# Patient Record
Sex: Female | Born: 1960
Health system: Southern US, Community
[De-identification: ages and names within clinical notes are randomized; demographics above are authoritative.]

## PROBLEM LIST (undated history)

## (undated) DIAGNOSIS — Z8619 Personal history of other infectious and parasitic diseases: Secondary | ICD-10-CM

## (undated) DIAGNOSIS — K625 Hemorrhage of anus and rectum: Secondary | ICD-10-CM

## (undated) DIAGNOSIS — D649 Anemia, unspecified: Secondary | ICD-10-CM

## (undated) DIAGNOSIS — R112 Nausea with vomiting, unspecified: Secondary | ICD-10-CM

## (undated) DIAGNOSIS — M810 Age-related osteoporosis without current pathological fracture: Secondary | ICD-10-CM

## (undated) DIAGNOSIS — S52023A Displaced fracture of olecranon process without intraarticular extension of unspecified ulna, initial encounter for closed fracture: Secondary | ICD-10-CM

## (undated) DIAGNOSIS — T07XXXA Unspecified multiple injuries, initial encounter: Secondary | ICD-10-CM

## (undated) DIAGNOSIS — T7840XA Allergy, unspecified, initial encounter: Secondary | ICD-10-CM

## (undated) DIAGNOSIS — Z9889 Other specified postprocedural states: Secondary | ICD-10-CM

## (undated) DIAGNOSIS — G510 Bell's palsy: Secondary | ICD-10-CM

## (undated) DIAGNOSIS — N840 Polyp of corpus uteri: Secondary | ICD-10-CM

## (undated) DIAGNOSIS — E785 Hyperlipidemia, unspecified: Secondary | ICD-10-CM

## (undated) DIAGNOSIS — K649 Unspecified hemorrhoids: Secondary | ICD-10-CM

## (undated) HISTORY — DX: Allergy, unspecified, initial encounter: T78.40XA

## (undated) HISTORY — DX: Hemorrhage of anus and rectum: K62.5

## (undated) HISTORY — DX: Bell's palsy: G51.0

## (undated) HISTORY — DX: Personal history of other infectious and parasitic diseases: Z86.19

## (undated) HISTORY — DX: Polyp of corpus uteri: N84.0

## (undated) HISTORY — DX: Age-related osteoporosis without current pathological fracture: M81.0

## (undated) HISTORY — PX: APPENDECTOMY: SHX54

## (undated) HISTORY — DX: Hyperlipidemia, unspecified: E78.5

## (undated) HISTORY — DX: Unspecified hemorrhoids: K64.9

---

## 2003-03-01 ENCOUNTER — Other Ambulatory Visit: Admission: RE | Admit: 2003-03-01 | Discharge: 2003-03-01 | Payer: Self-pay | Admitting: Obstetrics and Gynecology

## 2003-05-21 ENCOUNTER — Ambulatory Visit (HOSPITAL_COMMUNITY): Admission: RE | Admit: 2003-05-21 | Discharge: 2003-05-21 | Payer: Self-pay | Admitting: Obstetrics and Gynecology

## 2003-05-21 ENCOUNTER — Encounter (INDEPENDENT_AMBULATORY_CARE_PROVIDER_SITE_OTHER): Payer: Self-pay | Admitting: *Deleted

## 2003-05-21 HISTORY — PX: HYSTEROSCOPY WITH D & C: SHX1775

## 2003-05-21 HISTORY — PX: HYSTEROSCOPY W/D&C: SHX1775

## 2004-08-25 ENCOUNTER — Other Ambulatory Visit: Admission: RE | Admit: 2004-08-25 | Discharge: 2004-08-25 | Payer: Self-pay | Admitting: Obstetrics and Gynecology

## 2006-09-06 ENCOUNTER — Ambulatory Visit: Payer: Self-pay | Admitting: Internal Medicine

## 2006-09-06 LAB — CONVERTED CEMR LAB
ALT: 16 units/L (ref 0–40)
Alkaline Phosphatase: 43 units/L (ref 39–117)
BUN: 12 mg/dL (ref 6–23)
Basophils Absolute: 0 10*3/uL (ref 0.0–0.1)
Basophils Relative: 0.3 % (ref 0.0–1.0)
Bilirubin, Direct: 0.1 mg/dL (ref 0.0–0.3)
CO2: 28 meq/L (ref 19–32)
Eosinophils Relative: 3.3 % (ref 0.0–5.0)
Glucose, Bld: 92 mg/dL (ref 70–99)
HDL: 74.3 mg/dL (ref >39.0)
Hemoglobin: 10.6 g/dL — ABNORMAL LOW (ref 12.0–15.0)
Monocytes Absolute: 0.3 10*3/uL (ref 0.2–0.7)
Monocytes Relative: 9.3 % (ref 3.0–11.0)
Platelets: 265 10*3/uL (ref 150–400)
RBC: 4.14 M/uL (ref 3.87–5.11)
RDW: 18 % — ABNORMAL HIGH (ref 11.5–14.6)
Sodium: 139 meq/L (ref 135–145)
TSH: 2.17 microintl units/mL (ref 0.35–5.50)
Total Bilirubin: 0.8 mg/dL (ref 0.3–1.2)
Total CHOL/HDL Ratio: 2.6
Triglycerides: 33 mg/dL (ref 0–149)

## 2007-03-04 ENCOUNTER — Ambulatory Visit: Payer: Self-pay | Admitting: Internal Medicine

## 2007-03-04 DIAGNOSIS — D649 Anemia, unspecified: Secondary | ICD-10-CM

## 2007-03-06 ENCOUNTER — Ambulatory Visit: Payer: Self-pay | Admitting: Internal Medicine

## 2007-03-07 ENCOUNTER — Telehealth: Payer: Self-pay | Admitting: Internal Medicine

## 2007-03-10 ENCOUNTER — Ambulatory Visit: Payer: Self-pay | Admitting: Internal Medicine

## 2007-03-18 ENCOUNTER — Telehealth: Payer: Self-pay | Admitting: *Deleted

## 2007-03-18 LAB — CONVERTED CEMR LAB
Basophils Absolute: 0 10*3/uL (ref 0.0–0.1)
Eosinophils Absolute: 0.4 10*3/uL (ref 0.0–0.6)
Ferritin: 8 ng/mL — ABNORMAL LOW (ref 10.0–291.0)
HCT: 35.2 % — ABNORMAL LOW (ref 36.0–46.0)
Hemoglobin: 11.9 g/dL — ABNORMAL LOW (ref 12.0–15.0)
MCV: 86.8 fL (ref 78.0–100.0)
Monocytes Absolute: 0.3 10*3/uL (ref 0.2–0.7)
Neutrophils Relative %: 55.1 % (ref 43.0–77.0)
Platelets: 228 10*3/uL (ref 150–400)
RBC: 4.06 M/uL (ref 3.87–5.11)
WBC: 6.1 10*3/uL (ref 4.5–10.5)

## 2007-03-19 ENCOUNTER — Ambulatory Visit: Payer: Self-pay | Admitting: Internal Medicine

## 2007-03-19 DIAGNOSIS — K625 Hemorrhage of anus and rectum: Secondary | ICD-10-CM | POA: Insufficient documentation

## 2007-03-19 HISTORY — DX: Hemorrhage of anus and rectum: K62.5

## 2007-03-19 LAB — CONVERTED CEMR LAB: Hemoglobin: 13.6 g/dL

## 2007-04-15 ENCOUNTER — Encounter: Payer: Self-pay | Admitting: Gastroenterology

## 2007-04-15 ENCOUNTER — Ambulatory Visit: Payer: Self-pay | Admitting: Gastroenterology

## 2007-04-15 DIAGNOSIS — K5900 Constipation, unspecified: Secondary | ICD-10-CM | POA: Insufficient documentation

## 2007-05-16 ENCOUNTER — Encounter: Payer: Self-pay | Admitting: Internal Medicine

## 2007-05-16 ENCOUNTER — Ambulatory Visit: Payer: Self-pay | Admitting: Gastroenterology

## 2009-06-22 ENCOUNTER — Encounter: Payer: Self-pay | Admitting: Internal Medicine

## 2010-08-15 NOTE — Assessment & Plan Note (Signed)
[facility]                            [facility] OFFICE NOTE   NAME:[name], Tracey                              MRN:          [id number]  DATE:09/06/2006                            DOB:          March 21, 1961    RE-ESTABLISHING VISIT:   CHIEF COMPLAINT:  Physical.   HISTORY OF PRESENT ILLNESS:  Tracey [name] is a 50 year old nonsmoking Asian-  American female, whose children are seen in our practice, who comes in  today for the above reason.  She does see her GYN for regular check-ups  and she has not been seen in our practice in quite a while, but because  of her age, would like to get preventive visit evaluation.  She is  generally well, without any specific complaint of cardiovascular or  pulmonary difficulty, is fasting today for lab work.   PAST MEDICAL HISTORY:  See data base.  She did have chicken pox as a  child.  She is gravida 3, para 2, last Pap June 2007, done at [facility].  LMP Aug 24, 2006.  Last mammogram July 2007.  Unsure of her last tetanus shot.  It has been probably much more than  ten years ago.   SURGERIES:  Appendectomy in 1976.   FAMILY HISTORY:  Paternal grandmother had colon cancer.  Father had  elevated cholesterol.  Mother has osteoporosis and maybe thyroid  disease.   SOCIAL HISTORY:  Household of four, seven hours of sleep, negative TAD.  Does try to do regular exercise, including weights and aerobics.  She  has a master's degree and is a homemaker at present.   MEDICATIONS:  None.   DRUG ALLERGIES:  None reported.   REVIEW OF SYSTEMS:  Negative for chest pain, shortness of breath, other  cardiovascular or pulmonary symptoms, GI or GU symptoms.  Vision and  hearing negative.   OBJECTIVE:  Height 68 inches, weight 137, temperature 97.8, blood  pressure 88-90/64.  This is a well-developed, well-nourished, healthy-  appearing lady, in no acute distress.  HEENT:  Normocephalic.  TMs clear.  Eyes:  PERL.   OP clear.  NECK: Supple without masses, although question if thyroid is palpable.  There are no nodules.  No bruits are noted.  CHEST:  CTAP, is equal.  CARDIAC:  S1, S2, no gallops or murmurs.  Peripheral pulses present  without delay.  Negative CCE.  BREASTS:  No nodules or discharge.  Axilla is clear.  ABDOMEN:  Soft, without organomegaly, guarding or rebound.  EXTREMITIES:  No acute atrophy.  Good range of motion.  NEUROLOGIC:  Intact with no focal deficits.   IMPRESSION:  Exam, preventive maintenance.  Tdap given today.  CPX labs,  in addition to vitamin D level, because of her family history of  osteoporosis and slight build.  Could consider DEXA scan done at  menopause or if needed, and make sure she gets up to date mammograms and  her Pap smears.  We will call her about her results and follow up as  appropriate from there.  She  should get colon cancer screening when she  is 52.     Tracey Mends. Panosh, MD  Electronically Signed    WKP/MedQ  DD: 09/06/2006  DT: 09/06/2006  Job #: [id number]

## 2010-08-18 NOTE — H&P (Signed)
NAMELASHAI, Tracey [name]                                 ACCOUNT NO.:  [id number]   MEDICAL RECORD NO.:  [id number]                   PATIENT TYPE:  AMB   LOCATION:  [facility]                                  FACILITY:  [facility]   PHYSICIAN:  Duke Salvia. Marcelle Overlie, M.D.            DATE OF BIRTH:  January 05, 1961   DATE OF ADMISSION:  DATE OF DISCHARGE:                                HISTORY & PHYSICAL   CHIEF COMPLAINT:  Abnormal uterine bleeding.   HISTORY OF PRESENT ILLNESS:  This is a 50 year old Asian female Gravida II,  Para 2 currently using condoms. For the last six months she has noted  premenstrual bright red bleeding which has been distinctly unusual for her.  She underwent SHG in our office on April 22, 2003 that showed some  irregular thickened areas in the posterior myometrium suspicious for  adenomyosis, several small simple cysts and after saline what appeared to be  a small polyp within the fundal canal.  She presents now for a D&C and  hysteroscopy.  This procedure including the risks of bleeding, infection,  complications that may require additional surgery such as uterine  perforation were all reviewed with her which she understands and accepts.   ALLERGIES:  None.   PAST SURGICAL HISTORY:  None.   OBSTETRICAL HISTORY:  Two vaginal deliveries at term.   FAMILY HISTORY:  Significant for a father with hypertension.   CURRENT MEDICATIONS:  None.   GYN HISTORY:  Last Pap in November 2004 was normal.   PHYSICAL EXAMINATION:  VITAL SIGNS:  Temperature 98.2, blood pressure  120/78.  HEENT:  Unremarkable.  The neck is supple without masses.  LUNGS:  Clear.  CARDIOVASCULAR:  Regular rate and rhythm without murmurs, rubs or gallops.  BREASTS:  Without masses.  ABDOMEN:  Soft, flat and non-tender.  PELVIC EXAM:  Normal external genitalia.  Cervix is clear.  Uterus:  Normal  positional size. Adnexa are negative.  EXTREMITIES:  Unremarkable.  NEUROLOGIC:  Unremarkable.   IMPRESSION:   Abnormal uterine bleeding, probable endometrial polyp.   PLAN:  Dilatation, curettage and hysteroscopy.  The procedure and its risks  were reviewed as above.                                              Richard M. Marcelle Overlie, M.D.   RMH/MEDQ  D:  05/19/2003  T:  05/19/2003  Job:  [id number]

## 2010-08-18 NOTE — Op Note (Signed)
NAMERANDOLPH, PAONE                                 ACCOUNT NO.:  [id number]   MEDICAL RECORD NO.:  [id number]                   PATIENT TYPE:  AMB   LOCATION:  [facility]C                                  [facility]ACILITY:  [facility]   PHYSICIAN:  Duke Salvia. Marcelle Overlie, M.D.            DATE OF BIRTH:  [birth year]   DATE OF PROCEDURE:  05/21/2003  DATE OF DISCHARGE:                                 OPERATIVE REPORT   PREOPERATIVE DIAGNOSIS:  Abnormal uterine bleeding, possible endometrial  polyp.   POSTOPERATIVE DIAGNOSIS:  Abnormal uterine bleeding, possible endometrial  polyp.   PROCEDURE:  D&C hysteroscopy.   SURGEON:  Duke Salvia. Marcelle Overlie, M.D.   ANESTHESIA:  General mask plus paracervical block.   COMPLICATIONS:  None.   DESCRIPTION OF PROCEDURE:  __________ this patient into the operating room.  After an adequate level of general MAC anesthesia was obtained with the  patient's legs in stirrups the perineum and vagina prepped and draped in the  usual manner for [facility] hysteroscopy.  Bladder was drained.  EUA was carried  out.  The uterus was mid to posterior, normal size, mobile, adnexa negative.  Weighted speculum was positioned, cervix grasped with tenaculum,  paracervical block was created by infiltrating at 3 and 9 o'clock  submucosally 5-7 mL of 1% Xylocaine on either side after negative  aspiration.  The uterus was then sounded to 8-9 cm, was progressively  dilated to a 27-29 Pratt dilator.  A 7 mm 12-degree continuous flow  hysteroscope was then inserted.  There was some buildup of tissue at the  fundal area.  The scope was removed.  Polyp forceps and D&C was carried out.  A moderate amount of tissue was removed but no fluid tissue could be  removed.  The scope was reinserted and a good visualization was noted at  that time.  The cavity dimensions were normal.  I did not see any further  residual tissue or any tissue that may have been an endometrial polyp.  No  other abnormalities.  She tolerated  this well, went to recovery in good  condition.                                               Richard M. Marcelle Overlie, M.D.    RMH/MEDQ  D:  05/21/2003  T:  05/21/2003  Job:  [id number]

## 2012-01-26 ENCOUNTER — Emergency Department (HOSPITAL_COMMUNITY): Payer: BC Managed Care – PPO

## 2012-01-26 ENCOUNTER — Emergency Department (HOSPITAL_COMMUNITY)
Admission: EM | Admit: 2012-01-26 | Discharge: 2012-01-27 | Disposition: A | Discharge status: Home / Self Care | Payer: BC Managed Care – PPO | Attending: Emergency Medicine | Admitting: Emergency Medicine

## 2012-01-26 ENCOUNTER — Encounter (HOSPITAL_COMMUNITY): Payer: Self-pay | Admitting: Physical Medicine and Rehabilitation

## 2012-01-26 DIAGNOSIS — S52023A Displaced fracture of olecranon process without intraarticular extension of unspecified ulna, initial encounter for closed fracture: Secondary | ICD-10-CM

## 2012-01-26 DIAGNOSIS — T07XXXA Unspecified multiple injuries, initial encounter: Secondary | ICD-10-CM

## 2012-01-26 DIAGNOSIS — S298XXA Other specified injuries of thorax, initial encounter: Secondary | ICD-10-CM | POA: Insufficient documentation

## 2012-01-26 DIAGNOSIS — S42409A Unspecified fracture of lower end of unspecified humerus, initial encounter for closed fracture: Secondary | ICD-10-CM | POA: Insufficient documentation

## 2012-01-26 DIAGNOSIS — Y9389 Activity, other specified: Secondary | ICD-10-CM | POA: Insufficient documentation

## 2012-01-26 HISTORY — DX: Unspecified multiple injuries, initial encounter: T07.XXXA

## 2012-01-26 HISTORY — DX: Displaced fracture of olecranon process without intraarticular extension of unspecified ulna, initial encounter for closed fracture: S52.023A
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
## 2012-01-26 NOTE — ED Notes (Signed)
Pt now c/o L chest pain that increases when she breaths in.  No crepitus noted.  Breath sounds clear. X-ray ordered.

## 2012-01-26 NOTE — ED Notes (Signed)
Patient is resting comfortably. 

## 2012-01-27 ENCOUNTER — Emergency Department (HOSPITAL_COMMUNITY): Payer: BC Managed Care – PPO

## 2012-01-27 MED ORDER — IBUPROFEN 600 MG PO TABS: 600.0000 mg | ORAL_TABLET | Freq: Four times a day (QID) | ORAL | Status: DC | PRN

## 2012-01-27 MED ORDER — OXYCODONE-ACETAMINOPHEN 5-325 MG PO TABS: 1.0000 | ORAL_TABLET | Freq: Four times a day (QID) | ORAL | Status: DC | PRN

## 2012-01-27 MED ORDER — OXYCODONE-ACETAMINOPHEN 5-325 MG PO TABS
1.0000 | ORAL_TABLET | Freq: Once | ORAL | Status: AC
  Administered 2012-01-27: 1 via ORAL
  Filled 2012-01-27: qty 1

## 2012-01-27 NOTE — ED Provider Notes (Signed)
Medical screening examination/treatment/procedure(s) were performed by non-physician practitioner and as supervising physician I was immediately available for consultation/collaboration.  Brandt Loosen, MD 01/27/12 [phone]

## 2012-01-27 NOTE — ED Notes (Addendum)
Pt A.O. X 4. Ambulatory. Vitals stable.  Skin warm and dry. Respirations even and regular. Husband at bedside. NAD. Verbalized understanding of narcotic pain management vs. Non narcotic pain management. Able to locate information for follow up. No further questions at this time.
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
## 2012-01-27 NOTE — Progress Notes (Signed)
Orthopedic Tech Progress Note Patient Details:  Tracey [name] 29-Jul-1960 [id number]  Ortho Devices Type of Ortho Device: Sling immobilizer;Long arm splint   Haskell Flirt 01/27/2012, 5:00 AM

## 2012-01-27 NOTE — ED Notes (Signed)
Patient transported to X-ray 

## 2012-01-28 ENCOUNTER — Encounter (HOSPITAL_BASED_OUTPATIENT_CLINIC_OR_DEPARTMENT_OTHER): Payer: Self-pay | Admitting: *Deleted

## 2012-01-29 ENCOUNTER — Encounter (HOSPITAL_BASED_OUTPATIENT_CLINIC_OR_DEPARTMENT_OTHER): Payer: Self-pay | Admitting: Anesthesiology

## 2012-01-29 ENCOUNTER — Encounter (HOSPITAL_BASED_OUTPATIENT_CLINIC_OR_DEPARTMENT_OTHER): Payer: Self-pay | Admitting: Certified Registered"

## 2012-01-29 ENCOUNTER — Ambulatory Visit (HOSPITAL_BASED_OUTPATIENT_CLINIC_OR_DEPARTMENT_OTHER): Payer: BC Managed Care – PPO | Admitting: Anesthesiology

## 2012-01-29 ENCOUNTER — Encounter (HOSPITAL_BASED_OUTPATIENT_CLINIC_OR_DEPARTMENT_OTHER)
Admission: RE | Disposition: A | Discharge status: Home / Self Care | Payer: Self-pay | Source: Ambulatory Visit | Attending: Orthopedic Surgery

## 2012-01-29 ENCOUNTER — Ambulatory Visit (HOSPITAL_BASED_OUTPATIENT_CLINIC_OR_DEPARTMENT_OTHER)
Admission: RE | Admit: 2012-01-29 | Discharge: 2012-01-29 | Disposition: A | Discharge status: Home / Self Care | Payer: BC Managed Care – PPO | Source: Ambulatory Visit | Attending: Orthopedic Surgery | Admitting: Orthopedic Surgery

## 2012-01-29 ENCOUNTER — Other Ambulatory Visit: Payer: Self-pay | Admitting: Orthopedic Surgery

## 2012-01-29 ENCOUNTER — Encounter (HOSPITAL_BASED_OUTPATIENT_CLINIC_OR_DEPARTMENT_OTHER): Payer: Self-pay | Admitting: Orthopedic Surgery

## 2012-01-29 DIAGNOSIS — S52023A Displaced fracture of olecranon process without intraarticular extension of unspecified ulna, initial encounter for closed fracture: Secondary | ICD-10-CM | POA: Insufficient documentation

## 2012-01-29 HISTORY — DX: Displaced fracture of olecranon process without intraarticular extension of unspecified ulna, initial encounter for closed fracture: S52.023A

## 2012-01-29 HISTORY — DX: Unspecified multiple injuries, initial encounter: T07.XXXA

## 2012-01-29 HISTORY — PX: ORIF ELBOW FRACTURE: SHX5031

## 2012-01-29 SURGERY — OPEN REDUCTION INTERNAL FIXATION (ORIF) ELBOW/OLECRANON FRACTURE
Anesthesia: Regional | Site: Elbow | Laterality: Left | Wound class: Clean

## 2012-01-29 MED ORDER — PROMETHAZINE HCL 25 MG/ML IJ SOLN: 6.2500 mg | INTRAMUSCULAR | Status: DC | PRN

## 2012-01-29 MED ORDER — HYDROMORPHONE HCL PF 1 MG/ML IJ SOLN
0.2500 mg | INTRAMUSCULAR | Status: DC | PRN
  Administered 2012-01-29 (×4): 0.5 mg via INTRAVENOUS

## 2012-01-29 MED ORDER — ROPIVACAINE HCL 5 MG/ML IJ SOLN
INTRAMUSCULAR | Status: DC | PRN
  Administered 2012-01-29: 30 mL

## 2012-01-29 MED ORDER — PROPOFOL 10 MG/ML IV BOLUS
INTRAVENOUS | Status: DC | PRN
  Administered 2012-01-29: 200 mg via INTRAVENOUS

## 2012-01-29 MED ORDER — OXYCODONE-ACETAMINOPHEN 5-325 MG PO TABS: ORAL_TABLET | ORAL | Status: DC

## 2012-01-29 MED ORDER — CEFAZOLIN SODIUM-DEXTROSE 2-3 GM-% IV SOLR
2.0000 g | Freq: Once | INTRAVENOUS | Status: AC
  Administered 2012-01-29: 2 g via INTRAVENOUS

## 2012-01-29 MED ORDER — OXYCODONE HCL 5 MG PO TABS
5.0000 mg | ORAL_TABLET | Freq: Once | ORAL | Status: AC | PRN
  Administered 2012-01-29: 5 mg via ORAL

## 2012-01-29 MED ORDER — LIDOCAINE HCL (CARDIAC) 20 MG/ML IV SOLN
INTRAVENOUS | Status: DC | PRN
  Administered 2012-01-29: 60 mg via INTRAVENOUS

## 2012-01-29 MED ORDER — LACTATED RINGERS IV SOLN
INTRAVENOUS | Status: DC
  Administered 2012-01-29 (×2): via INTRAVENOUS

## 2012-01-29 MED ORDER — ONDANSETRON HCL 4 MG/2ML IJ SOLN
INTRAMUSCULAR | Status: DC | PRN
  Administered 2012-01-29: 4 mg via INTRAVENOUS

## 2012-01-29 MED ORDER — MEPERIDINE HCL 25 MG/ML IJ SOLN: 6.2500 mg | INTRAMUSCULAR | Status: DC | PRN

## 2012-01-29 MED ORDER — MIDAZOLAM HCL 2 MG/2ML IJ SOLN
1.0000 mg | INTRAMUSCULAR | Status: DC | PRN
  Administered 2012-01-29: 1.5 mg via INTRAVENOUS

## 2012-01-29 MED ORDER — DEXAMETHASONE SODIUM PHOSPHATE 4 MG/ML IJ SOLN
INTRAMUSCULAR | Status: DC | PRN
  Administered 2012-01-29: 8 mg via INTRAVENOUS

## 2012-01-29 MED ORDER — FENTANYL CITRATE 0.05 MG/ML IJ SOLN
INTRAMUSCULAR | Status: DC | PRN
  Administered 2012-01-29 (×3): 25 ug via INTRAVENOUS

## 2012-01-29 MED ORDER — OXYCODONE HCL 5 MG/5ML PO SOLN: 5.0000 mg | Freq: Once | ORAL | Status: AC | PRN

## 2012-01-29 MED ORDER — FENTANYL CITRATE 0.05 MG/ML IJ SOLN
100.0000 ug | Freq: Once | INTRAMUSCULAR | Status: AC
  Administered 2012-01-29: 75 ug via INTRAVENOUS

## 2012-01-29 SURGICAL SUPPLY — 76 items
BANDAGE CONFORM 3  STR LF (GAUZE/BANDAGES/DRESSINGS) IMPLANT
BANDAGE ELASTIC 3 VELCRO ST LF (GAUZE/BANDAGES/DRESSINGS) ×4 IMPLANT
BANDAGE GAUZE ELAST BULKY 4 IN (GAUZE/BANDAGES/DRESSINGS) ×2 IMPLANT
BENZOIN TINCTURE PRP APPL 2/3 (GAUZE/BANDAGES/DRESSINGS) ×2 IMPLANT
BIT DRILL 2.5X2.75 QC CALB (BIT) ×2 IMPLANT
BIT DRILL CALIBRATED 2.7 (BIT) ×2 IMPLANT
BLADE MINI RND TIP GREEN BEAV (BLADE) IMPLANT
BLADE SURG 15 STRL LF DISP TIS (BLADE) ×3 IMPLANT
BLADE SURG 15 STRL SS (BLADE) ×3
BNDG ELASTIC 2 VLCR STRL LF (GAUZE/BANDAGES/DRESSINGS) IMPLANT
BNDG ESMARK 4X9 LF (GAUZE/BANDAGES/DRESSINGS) ×2 IMPLANT
BNDG PLASTER X FAST 3X3 WHT LF (CAST SUPPLIES) ×60 IMPLANT
BNDG PLASTER X FAST 4X5 WHT LF (CAST SUPPLIES) IMPLANT
CHLORAPREP W/TINT 26ML (MISCELLANEOUS) ×2 IMPLANT
CLOTH BEACON ORANGE TIMEOUT ST (SAFETY) ×2 IMPLANT
CORDS BIPOLAR (ELECTRODE) ×2 IMPLANT
COVER MAYO STAND STRL (DRAPES) ×2 IMPLANT
COVER TABLE BACK 60X90 (DRAPES) ×2 IMPLANT
DRAPE EXTREMITY T 121X128X90 (DRAPE) ×2 IMPLANT
DRAPE OEC MINIVIEW 54X84 (DRAPES) ×2 IMPLANT
DRAPE SURG 17X23 STRL (DRAPES) ×2 IMPLANT
GAUZE XEROFORM 1X8 LF (GAUZE/BANDAGES/DRESSINGS) ×2 IMPLANT
GLOVE BIO SURGEON STRL SZ7.5 (GLOVE) ×2 IMPLANT
GLOVE BIOGEL PI IND STRL 6.5 (GLOVE) ×1 IMPLANT
GLOVE BIOGEL PI IND STRL 8 (GLOVE) ×1 IMPLANT
GLOVE BIOGEL PI IND STRL 8.5 (GLOVE) ×1 IMPLANT
GLOVE BIOGEL PI INDICATOR 6.5 (GLOVE) ×1
GLOVE BIOGEL PI INDICATOR 8 (GLOVE) ×1
GLOVE BIOGEL PI INDICATOR 8.5 (GLOVE) ×1
GLOVE SURG ORTHO 8.0 STRL STRW (GLOVE) ×2 IMPLANT
GOWN PREVENTION PLUS XLARGE (GOWN DISPOSABLE) ×2 IMPLANT
GOWN PREVENTION PLUS XXLARGE (GOWN DISPOSABLE) ×2 IMPLANT
GOWN STRL REIN XL XLG (GOWN DISPOSABLE) ×4 IMPLANT
K-WIRE FIXATION 2.0X6 (WIRE) ×4
KWIRE FIXATION 2.0X6 (WIRE) ×2 IMPLANT
NEEDLE FILTER BLUNT 18X 1/2SAF (NEEDLE)
NEEDLE FILTER BLUNT 18X1 1/2 (NEEDLE) IMPLANT
NEEDLE HYPO 22GX1.5 SAFETY (NEEDLE) IMPLANT
NEEDLE HYPO 25X1 1.5 SAFETY (NEEDLE) IMPLANT
NS IRRIG 1000ML POUR BTL (IV SOLUTION) ×2 IMPLANT
PACK BASIN DAY SURGERY FS (CUSTOM PROCEDURE TRAY) ×2 IMPLANT
PAD CAST 3X4 CTTN HI CHSV (CAST SUPPLIES) IMPLANT
PAD CAST 4YDX4 CTTN HI CHSV (CAST SUPPLIES) IMPLANT
PADDING CAST ABS 4INX4YD NS (CAST SUPPLIES) ×1
PADDING CAST ABS COTTON 4X4 ST (CAST SUPPLIES) ×1 IMPLANT
PADDING CAST COTTON 3X4 STRL (CAST SUPPLIES)
PADDING CAST COTTON 4X4 STRL (CAST SUPPLIES)
PLATE OLECRANON SM (Plate) ×2 IMPLANT
SCREW CORT T15 28X3.5XST LCK (Screw) ×1 IMPLANT
SCREW CORTICAL 3.5X28MM (Screw) ×2 IMPLANT
SCREW LOCK CORT STAR 3.5X16 (Screw) ×6 IMPLANT
SCREW LOCK CORT STAR 3.5X18 (Screw) ×2 IMPLANT
SCREW LOCK CORT STAR 3.5X30 (Screw) ×2 IMPLANT
SCREW LOW PROFILE 18MMX3.5MM (Screw) ×4 IMPLANT
SCREW LOW PROFILE 22MMX3.5MM (Screw) ×2 IMPLANT
SLEEVE SCD COMPRESS KNEE MED (MISCELLANEOUS) ×2 IMPLANT
SLEEVE SURGEON STRL (DRAPES) ×2 IMPLANT
SPLINT PLASTER CAST XFAST 4X15 (CAST SUPPLIES) IMPLANT
SPLINT PLASTER XTRA FAST SET 4 (CAST SUPPLIES)
SPONGE GAUZE 4X4 12PLY (GAUZE/BANDAGES/DRESSINGS) ×2 IMPLANT
STOCKINETTE 4X48 STRL (DRAPES) ×2 IMPLANT
STRIP CLOSURE SKIN 1/2X4 (GAUZE/BANDAGES/DRESSINGS) ×2 IMPLANT
SUCTION FRAZIER TIP 10 FR DISP (SUCTIONS) IMPLANT
SUT ETHILON 3 0 PS 1 (SUTURE) IMPLANT
SUT ETHILON 4 0 PS 2 18 (SUTURE) ×2 IMPLANT
SUT MNCRL AB 3-0 PS2 18 (SUTURE) ×2 IMPLANT
SUT VIC AB 3-0 PS1 18 (SUTURE) ×2
SUT VIC AB 3-0 PS1 18XBRD (SUTURE) ×2 IMPLANT
SUT VICRYL 4-0 PS2 18IN ABS (SUTURE) ×2 IMPLANT
SYR BULB 3OZ (MISCELLANEOUS) ×2 IMPLANT
SYR CONTROL 10ML LL (SYRINGE) IMPLANT
TOWEL OR 17X24 6PK STRL BLUE (TOWEL DISPOSABLE) ×4 IMPLANT
TUBE CONNECTING 20X1/4 (TUBING) IMPLANT
UNDERPAD 30X30 INCONTINENT (UNDERPADS AND DIAPERS) ×2 IMPLANT
WASHER 3.5MM (Orthopedic Implant) ×2 IMPLANT
WATER STERILE IRR 1000ML POUR (IV SOLUTION) IMPLANT

## 2012-01-29 NOTE — Anesthesia Procedure Notes (Addendum)
Anesthesia Regional Block:   Narrative:    Anesthesia Regional Block:  Supraclavicular block  Pre-Anesthetic Checklist: ,, timeout performed, Correct Patient, Correct Site, Correct Laterality, Correct Procedure, Correct Position, site marked, Risks and benefits discussed, Surgical consent, post-op pain management  Laterality: Left and Upper  Prep: chloraprep       Needles:  Injection technique: Single-shot  Needle Type: Echogenic Needle     Needle Length: 10cm 10 cm   Needle insertion depth: 4 cm   Additional Needles:  Procedures: ultrasound guided Supraclavicular block Narrative:  Start time: 01/29/2012 10:55 AM End time: 01/29/2012 11:14 AM Injection made incrementally with aspirations every 5 mL.  Performed by: Personally  Anesthesiologist: T Massagee  Additional Notes: Tolerated well   Procedure Name: LMA Insertion Date/Time: 01/29/2012 12:02 PM Performed by: Verlan Friends Pre-anesthesia Checklist: Patient identified, Emergency Drugs available, Suction available, Patient being monitored and Timeout performed Patient Re-evaluated:Patient Re-evaluated prior to inductionOxygen Delivery Method: Circle System Utilized Preoxygenation: Pre-oxygenation with 100% oxygen Intubation Type: IV induction Ventilation: Mask ventilation without difficulty LMA: LMA inserted LMA Size: 4.0 Number of attempts: 1 Airway Equipment and Method: bite block Placement Confirmation: positive ETCO2 Tube secured with: Tape Dental Injury: Teeth and Oropharynx as per pre-operative assessment

## 2012-01-29 NOTE — Progress Notes (Signed)
Assisted Dr. Massagee with left, ultrasound guided, supraclavicular block. Side rails up, monitors on throughout procedure. See vital signs in flow sheet. Tolerated Procedure well. 

## 2012-01-29 NOTE — Op Note (Signed)
Dictation [id number]
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
## 2012-01-29 NOTE — Anesthesia Postprocedure Evaluation (Signed)
  Anesthesia Post-op Note  Patient: Tracey [name]  Procedure(s) Performed: Procedure(s) (LRB) with comments: OPEN REDUCTION INTERNAL FIXATION (ORIF) ELBOW/OLECRANON FRACTURE (Left) - OPEN REDUCTION INTERNAL FIXATION LEFT OLECRANON FRACTURE   Patient Location: PACU  Anesthesia Type:General  Level of Consciousness: awake  Airway and Oxygen Therapy: Patient Spontanous Breathing and Patient connected to face mask oxygen  Post-op Pain: mild  Post-op Assessment: Post-op Vital signs reviewed  Post-op Vital Signs: Reviewed  Complications: No apparent anesthesia complications

## 2012-01-29 NOTE — Anesthesia Preprocedure Evaluation (Signed)
Anesthesia Evaluation  Patient identified by MRN, date of birth, ID band Patient awake    Reviewed: Allergy & Precautions, H&P , NPO status , Patient's Chart, lab work & pertinent test results  History of Anesthesia Complications Negative for: history of anesthetic complications  Airway Mallampati: I  Neck ROM: full    Dental No notable dental hx. (+) Teeth Intact   Pulmonary neg pulmonary ROS,  breath sounds clear to auscultation  Pulmonary exam normal       Cardiovascular negative cardio ROS  IRhythm:regular Rate:Normal     Neuro/Psych negative neurological ROS  negative psych ROS   GI/Hepatic negative GI ROS, Neg liver ROS,   Endo/Other  negative endocrine ROS  Renal/GU negative Renal ROS  negative genitourinary   Musculoskeletal   Abdominal   Peds  Hematology negative hematology ROS (+)   Anesthesia Other Findings   Reproductive/Obstetrics negative OB ROS                           Anesthesia Physical Anesthesia Plan  ASA: I  Anesthesia Plan: Regional and General LMA   Post-op Pain Management:    Induction:   Airway Management Planned:   Additional Equipment:   Intra-op Plan:   Post-operative Plan:   Informed Consent: I have reviewed the patients History and Physical, chart, labs and discussed the procedure including the risks, benefits and alternatives for the proposed anesthesia with the patient or authorized representative who has indicated his/her understanding and acceptance.     Plan Discussed with: CRNA and Surgeon  Anesthesia Plan Comments:         Anesthesia Quick Evaluation

## 2012-01-29 NOTE — Transfer of Care (Signed)
Immediate Anesthesia Transfer of Care Note  Patient: Saleena Liegel  Procedure(s) Performed: Procedure(s) (LRB) with comments: OPEN REDUCTION INTERNAL FIXATION (ORIF) ELBOW/OLECRANON FRACTURE (Left) - OPEN REDUCTION INTERNAL FIXATION LEFT OLECRANON FRACTURE   Patient Location: PACU  Anesthesia Type:GA combined with regional for post-op pain  Level of Consciousness: awake, alert , oriented and patient cooperative  Airway & Oxygen Therapy: Patient Spontanous Breathing and Patient connected to face mask oxygen  Post-op Assessment: Report given to PACU RN and Post -op Vital signs reviewed and stable  Post vital signs: Reviewed and stable  Complications: No apparent anesthesia complications
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
## 2012-01-31 ENCOUNTER — Encounter (HOSPITAL_BASED_OUTPATIENT_CLINIC_OR_DEPARTMENT_OTHER): Payer: Self-pay | Admitting: Orthopedic Surgery

## 2012-02-04 ENCOUNTER — Encounter (HOSPITAL_BASED_OUTPATIENT_CLINIC_OR_DEPARTMENT_OTHER): Payer: Self-pay

## 2012-06-02 ENCOUNTER — Encounter: Payer: Self-pay | Admitting: Internal Medicine

## 2012-06-02 ENCOUNTER — Ambulatory Visit (INDEPENDENT_AMBULATORY_CARE_PROVIDER_SITE_OTHER): Payer: BC Managed Care – PPO | Admitting: Internal Medicine

## 2012-06-02 VITALS — BP 100/70 | HR 87 | Temp 98.2°F | Ht 68.0 in | Wt 134.0 lb

## 2012-06-02 DIAGNOSIS — Z8262 Family history of osteoporosis: Secondary | ICD-10-CM

## 2012-06-02 DIAGNOSIS — Z8669 Personal history of other diseases of the nervous system and sense organs: Secondary | ICD-10-CM

## 2012-06-02 DIAGNOSIS — Z8349 Family history of other endocrine, nutritional and metabolic diseases: Secondary | ICD-10-CM

## 2012-06-02 DIAGNOSIS — R141 Gas pain: Secondary | ICD-10-CM

## 2012-06-02 DIAGNOSIS — R140 Abdominal distension (gaseous): Secondary | ICD-10-CM | POA: Insufficient documentation

## 2012-06-02 DIAGNOSIS — Z299 Encounter for prophylactic measures, unspecified: Secondary | ICD-10-CM

## 2012-06-02 DIAGNOSIS — N920 Excessive and frequent menstruation with regular cycle: Secondary | ICD-10-CM

## 2012-06-02 DIAGNOSIS — D649 Anemia, unspecified: Secondary | ICD-10-CM | POA: Insufficient documentation

## 2012-06-02 DIAGNOSIS — Z87898 Personal history of other specified conditions: Secondary | ICD-10-CM

## 2012-06-02 LAB — FERRITIN: Ferritin: 4.4 ng/mL — ABNORMAL LOW (ref 10.0–291.0)

## 2012-06-02 LAB — CBC WITH DIFFERENTIAL/PLATELET
Basophils Relative: 1 % (ref 0.0–3.0)
Eosinophils Absolute: 0.2 10*3/uL (ref 0.0–0.7)
HCT: 30.8 % — ABNORMAL LOW (ref 36.0–46.0)
Lymphs Abs: 1.3 10*3/uL (ref 0.7–4.0)
MCHC: 32.1 g/dL (ref 30.0–36.0)
MCV: 74.4 fl — ABNORMAL LOW (ref 78.0–100.0)
Monocytes Absolute: 0.3 10*3/uL (ref 0.1–1.0)
Neutro Abs: 1.8 10*3/uL (ref 1.4–7.7)
Neutrophils Relative %: 50.6 % (ref 43.0–77.0)
RBC: 4.14 Mil/uL (ref 3.87–5.11)

## 2012-06-02 LAB — HEPATIC FUNCTION PANEL
ALT: 16 U/L (ref 0–35)
Albumin: 3.7 g/dL (ref 3.5–5.2)
Bilirubin, Direct: 0 mg/dL (ref 0.0–0.3)
Total Protein: 6.8 g/dL (ref 6.0–8.3)

## 2012-06-02 LAB — IBC PANEL
Iron: 17 ug/dL — ABNORMAL LOW (ref 42–145)
Saturation Ratios: 4 % — ABNORMAL LOW (ref 20.0–50.0)
Transferrin: 302.2 mg/dL (ref 212.0–360.0)

## 2012-06-02 LAB — LIPID PANEL
Cholesterol: 177 mg/dL (ref 0–200)
HDL: 71.8 mg/dL (ref >39.00)
Triglycerides: 65 mg/dL (ref 0.0–149.0)

## 2012-06-02 LAB — BASIC METABOLIC PANEL
CO2: 25 mEq/L (ref 19–32)
Chloride: 106 mEq/L (ref 96–112)
Creatinine, Ser: 0.6 mg/dL (ref 0.4–1.2)
Potassium: 3.7 mEq/L (ref 3.5–5.1)

## 2012-06-02 LAB — FOLATE: Folate: >24.8 ng/mL (ref >5.9)

## 2012-06-02 LAB — VITAMIN B12: Vitamin B-12: 568 pg/mL (ref 211–911)

## 2012-06-02 LAB — SEDIMENTATION RATE: Sed Rate: 27 mm/hr — ABNORMAL HIGH (ref 0–22)

## 2012-06-02 NOTE — Patient Instructions (Signed)
Agree with stopping the aspirin and avoiding ibuprofen and Aleve products at this time.  Will notify you  of labs when available. Sing up for my chart to view results.   This is most likely from iron deficiency your exam is normal. If you or getting progressive stomach problems and weight loss associated  needed followup visit to assess.  Plan followup appointment depending on results of your labwork.
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
## 2012-06-03 LAB — CELIAC PANEL 10: Tissue Transglut Ab: 6.7 U/mL (ref <20)

## 2012-06-09 ENCOUNTER — Telehealth: Payer: Self-pay | Admitting: Internal Medicine

## 2012-06-09 ENCOUNTER — Other Ambulatory Visit: Payer: Self-pay | Admitting: Family Medicine

## 2012-06-09 DIAGNOSIS — D509 Iron deficiency anemia, unspecified: Secondary | ICD-10-CM

## 2012-06-09 NOTE — Telephone Encounter (Signed)
How much iron?

## 2012-06-09 NOTE — Telephone Encounter (Signed)
Patient Information:  Caller Name: Tracey [name]  Phone: [phone]  Patient: Tracey, [name]  Gender: Female  DOB: 23-Dec-1960  Age: 52 Years  PCP: Berniece Andreas [facility])  Pregnant: No  Office Follow Up:  Does the office need to follow up with this patient?: Yes  Instructions For The Office: Please determine the amount of iron patient should take twice a day.  She states it is ok to leave this on voice mail at number she called from. Thank you.  RN Note:  Result Notes    Notes Recorded by Nils Flack, CMA on 06/09/2012 at 12:22 PM Left message on home number for the pt to return my call. ------  Notes Recorded by Nils Flack, CMA on 06/09/2012 at 12:21 PM Lab ordered ------  Notes Recorded by Madelin Headings, MD on 06/07/2012 at 4:11 PM Message left in My chart   Labs confirm that your anemia is from iron deficiency.  Advise take iron supplement  Twice a day is best.      Advise  Repeat  Blood count  ( cbc and diff  In 6-8 weeks  . If not improving plan follow up visit.   Please arrange for CBC diff in 6-8 weeks  Dx iron deficiency anemia .  Symptoms  Reason For Call & Symptoms: Patient states she has not received any instructions from office about what to do since she had labs done recently. She asks about Iron supplements and how much she should take.   Reviewed EMR and found attached notes in Labs; caller asks for specific amount of iron that she should take.  Note to office for follow up.  Reviewed Health History In EMR: Yes  Reviewed Medications In EMR: Yes  Reviewed Allergies In EMR: Yes  Reviewed Surgeries / Procedures: Yes  Date of Onset of Symptoms: Unknown OB / GYN:  LMP: 05/16/2012  Guideline(s) Used:  No Protocol Available - Sick Adult  Disposition Per Guideline:   Discuss with PCP and Callback by Nurse Today  Reason For Disposition Reached:   Nursing judgment  Advice Given:  Call Back If:  New symptoms develop  You become worse.

## 2012-06-10 ENCOUNTER — Encounter: Payer: Self-pay | Admitting: Internal Medicine

## 2012-06-11 NOTE — Telephone Encounter (Signed)
Ferrous sulfate 325 take twice a day either with juice or 250 mg of vitamin c  To help absorption .  There are many preparations of this    No specific brand name if preferred . Some times  If gets constipation and lots of gas can try the other preparations such as gluconate but they dont have has much available iron in them

## 2012-06-12 ENCOUNTER — Other Ambulatory Visit: Payer: Self-pay | Admitting: Family Medicine

## 2012-06-12 NOTE — Telephone Encounter (Signed)
Copied and pasted to the lab results so when I speak to the pt I will be able to tell her.  This note no longer needed.

## 2012-06-17 ENCOUNTER — Encounter: Payer: Self-pay | Admitting: Family Medicine

## 2012-07-25 ENCOUNTER — Other Ambulatory Visit: Payer: Self-pay | Admitting: Orthopedic Surgery

## 2012-07-29 ENCOUNTER — Encounter (HOSPITAL_BASED_OUTPATIENT_CLINIC_OR_DEPARTMENT_OTHER): Payer: Self-pay | Admitting: *Deleted
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
## 2012-07-31 ENCOUNTER — Encounter (HOSPITAL_BASED_OUTPATIENT_CLINIC_OR_DEPARTMENT_OTHER): Payer: Self-pay | Admitting: *Deleted

## 2012-07-31 ENCOUNTER — Encounter (HOSPITAL_BASED_OUTPATIENT_CLINIC_OR_DEPARTMENT_OTHER)
Admission: RE | Disposition: A | Discharge status: Home / Self Care | Payer: Self-pay | Source: Ambulatory Visit | Attending: Orthopedic Surgery

## 2012-07-31 ENCOUNTER — Ambulatory Visit (HOSPITAL_BASED_OUTPATIENT_CLINIC_OR_DEPARTMENT_OTHER): Payer: BC Managed Care – PPO | Admitting: Anesthesiology

## 2012-07-31 ENCOUNTER — Encounter (HOSPITAL_BASED_OUTPATIENT_CLINIC_OR_DEPARTMENT_OTHER): Payer: Self-pay | Admitting: Anesthesiology

## 2012-07-31 ENCOUNTER — Ambulatory Visit (HOSPITAL_BASED_OUTPATIENT_CLINIC_OR_DEPARTMENT_OTHER)
Admission: RE | Admit: 2012-07-31 | Discharge: 2012-07-31 | Disposition: A | Discharge status: Home / Self Care | Payer: BC Managed Care – PPO | Source: Ambulatory Visit | Attending: Orthopedic Surgery | Admitting: Orthopedic Surgery

## 2012-07-31 DIAGNOSIS — T8489XA Other specified complication of internal orthopedic prosthetic devices, implants and grafts, initial encounter: Secondary | ICD-10-CM | POA: Insufficient documentation

## 2012-07-31 DIAGNOSIS — L905 Scar conditions and fibrosis of skin: Secondary | ICD-10-CM | POA: Insufficient documentation

## 2012-07-31 DIAGNOSIS — Y831 Surgical operation with implant of artificial internal device as the cause of abnormal reaction of the patient, or of later complication, without mention of misadventure at the time of the procedure: Secondary | ICD-10-CM | POA: Insufficient documentation

## 2012-07-31 HISTORY — PX: HARDWARE REMOVAL: SHX979

## 2012-07-31 HISTORY — DX: Nausea with vomiting, unspecified: R11.2

## 2012-07-31 HISTORY — DX: Anemia, unspecified: D64.9

## 2012-07-31 HISTORY — DX: Other specified postprocedural states: Z98.890

## 2012-07-31 SURGERY — REMOVAL, HARDWARE
Anesthesia: General | Site: Elbow | Laterality: Left | Wound class: Clean

## 2012-07-31 MED ORDER — CHLORHEXIDINE GLUCONATE 4 % EX LIQD: 60.0000 mL | Freq: Once | CUTANEOUS | Status: DC

## 2012-07-31 MED ORDER — OXYCODONE HCL 5 MG PO TABS: 5.0000 mg | ORAL_TABLET | Freq: Once | ORAL | Status: DC | PRN

## 2012-07-31 MED ORDER — PROPOFOL 10 MG/ML IV BOLUS
INTRAVENOUS | Status: DC | PRN
  Administered 2012-07-31: 250 mg via INTRAVENOUS

## 2012-07-31 MED ORDER — BUPIVACAINE HCL (PF) 0.25 % IJ SOLN
INTRAMUSCULAR | Status: DC | PRN
  Administered 2012-07-31: 10 mL

## 2012-07-31 MED ORDER — FENTANYL CITRATE 0.05 MG/ML IJ SOLN
INTRAMUSCULAR | Status: DC | PRN
  Administered 2012-07-31 (×2): 25 ug via INTRAVENOUS
  Administered 2012-07-31: 50 ug via INTRAVENOUS
  Administered 2012-07-31: 25 ug via INTRAVENOUS

## 2012-07-31 MED ORDER — ONDANSETRON HCL 4 MG/2ML IJ SOLN
INTRAMUSCULAR | Status: DC | PRN
  Administered 2012-07-31: 4 mg via INTRAVENOUS

## 2012-07-31 MED ORDER — SCOPOLAMINE 1 MG/3DAYS TD PT72: 1.0000 | MEDICATED_PATCH | TRANSDERMAL | Status: DC

## 2012-07-31 MED ORDER — CEFAZOLIN SODIUM-DEXTROSE 2-3 GM-% IV SOLR
2.0000 g | INTRAVENOUS | Status: AC
  Administered 2012-07-31: 2 g via INTRAVENOUS

## 2012-07-31 MED ORDER — DEXAMETHASONE SODIUM PHOSPHATE 4 MG/ML IJ SOLN
INTRAMUSCULAR | Status: DC | PRN
  Administered 2012-07-31: 8 mg via INTRAVENOUS

## 2012-07-31 MED ORDER — LIDOCAINE HCL (CARDIAC) 20 MG/ML IV SOLN
INTRAVENOUS | Status: DC | PRN
  Administered 2012-07-31: 50 mg via INTRAVENOUS

## 2012-07-31 MED ORDER — HYDROMORPHONE HCL PF 1 MG/ML IJ SOLN
0.2500 mg | INTRAMUSCULAR | Status: DC | PRN
  Administered 2012-07-31: 0.5 mg via INTRAVENOUS

## 2012-07-31 MED ORDER — MIDAZOLAM HCL 2 MG/2ML IJ SOLN: 1.0000 mg | INTRAMUSCULAR | Status: DC | PRN

## 2012-07-31 MED ORDER — ONDANSETRON HCL 4 MG/2ML IJ SOLN: 4.0000 mg | Freq: Once | INTRAMUSCULAR | Status: DC | PRN

## 2012-07-31 MED ORDER — OXYCODONE HCL 5 MG/5ML PO SOLN: 5.0000 mg | Freq: Once | ORAL | Status: DC | PRN

## 2012-07-31 MED ORDER — ACETAMINOPHEN 10 MG/ML IV SOLN
INTRAVENOUS | Status: DC | PRN
  Administered 2012-07-31: 1000 mg via INTRAVENOUS

## 2012-07-31 MED ORDER — MIDAZOLAM HCL 5 MG/5ML IJ SOLN
INTRAMUSCULAR | Status: DC | PRN
  Administered 2012-07-31: 2 mg via INTRAVENOUS

## 2012-07-31 MED ORDER — FENTANYL CITRATE 0.05 MG/ML IJ SOLN: 50.0000 ug | INTRAMUSCULAR | Status: DC | PRN

## 2012-07-31 MED ORDER — OXYCODONE-ACETAMINOPHEN 5-325 MG PO TABS: ORAL_TABLET | ORAL | Status: DC

## 2012-07-31 MED ORDER — LACTATED RINGERS IV SOLN
INTRAVENOUS | Status: DC
  Administered 2012-07-31: 09:00:00 via INTRAVENOUS

## 2012-07-31 SURGICAL SUPPLY — 49 items
BANDAGE ELASTIC 3 VELCRO ST LF (GAUZE/BANDAGES/DRESSINGS) ×2 IMPLANT
BANDAGE ELASTIC 4 VELCRO ST LF (GAUZE/BANDAGES/DRESSINGS) ×2 IMPLANT
BANDAGE GAUZE ELAST BULKY 4 IN (GAUZE/BANDAGES/DRESSINGS) ×2 IMPLANT
BENZOIN TINCTURE PRP APPL 2/3 (GAUZE/BANDAGES/DRESSINGS) ×2 IMPLANT
BLADE MINI RND TIP GREEN BEAV (BLADE) IMPLANT
BLADE SURG 15 STRL LF DISP TIS (BLADE) ×3 IMPLANT
BLADE SURG 15 STRL SS (BLADE) ×3
BNDG COHESIVE 1X5 TAN STRL LF (GAUZE/BANDAGES/DRESSINGS) IMPLANT
BNDG ELASTIC 2 VLCR STRL LF (GAUZE/BANDAGES/DRESSINGS) IMPLANT
BNDG ESMARK 4X9 LF (GAUZE/BANDAGES/DRESSINGS) ×2 IMPLANT
BNDG PLASTER X FAST 3X3 WHT LF (CAST SUPPLIES) ×2 IMPLANT
CHLORAPREP W/TINT 26ML (MISCELLANEOUS) ×2 IMPLANT
CLOTH BEACON ORANGE TIMEOUT ST (SAFETY) ×2 IMPLANT
CORDS BIPOLAR (ELECTRODE) ×2 IMPLANT
COVER MAYO STAND STRL (DRAPES) ×2 IMPLANT
COVER TABLE BACK 60X90 (DRAPES) ×2 IMPLANT
CUFF TOURNIQUET SINGLE 18IN (TOURNIQUET CUFF) ×2 IMPLANT
DRAPE EXTREMITY T 121X128X90 (DRAPE) ×2 IMPLANT
DRAPE SURG 17X23 STRL (DRAPES) ×2 IMPLANT
DRSG PAD ABDOMINAL 8X10 ST (GAUZE/BANDAGES/DRESSINGS) IMPLANT
GAUZE SPONGE 4X4 12PLY STRL LF (GAUZE/BANDAGES/DRESSINGS) ×2 IMPLANT
GAUZE XEROFORM 1X8 LF (GAUZE/BANDAGES/DRESSINGS) ×2 IMPLANT
GLOVE BIO SURGEON STRL SZ7.5 (GLOVE) ×2 IMPLANT
GLOVE INDICATOR 8.0 STRL GRN (GLOVE) ×2 IMPLANT
GLOVE SKINSENSE NS SZ7.0 (GLOVE) ×1
GLOVE SKINSENSE STRL SZ7.0 (GLOVE) ×1 IMPLANT
GOWN BRE IMP PREV XXLGXLNG (GOWN DISPOSABLE) ×2 IMPLANT
GOWN PREVENTION PLUS XLARGE (GOWN DISPOSABLE) ×2 IMPLANT
NEEDLE HYPO 25X1 1.5 SAFETY (NEEDLE) ×2 IMPLANT
NS IRRIG 1000ML POUR BTL (IV SOLUTION) ×2 IMPLANT
PACK BASIN DAY SURGERY FS (CUSTOM PROCEDURE TRAY) ×2 IMPLANT
PAD CAST 3X4 CTTN HI CHSV (CAST SUPPLIES) IMPLANT
PAD CAST 4YDX4 CTTN HI CHSV (CAST SUPPLIES) ×1 IMPLANT
PADDING CAST ABS 4INX4YD NS (CAST SUPPLIES) ×1
PADDING CAST ABS COTTON 4X4 ST (CAST SUPPLIES) ×1 IMPLANT
PADDING CAST COTTON 3X4 STRL (CAST SUPPLIES)
PADDING CAST COTTON 4X4 STRL (CAST SUPPLIES) ×1
SPLINT PLASTER CAST XFAST 3X15 (CAST SUPPLIES) ×2 IMPLANT
SPLINT PLASTER XTRA FASTSET 3X (CAST SUPPLIES) ×2
SPONGE GAUZE 4X4 12PLY (GAUZE/BANDAGES/DRESSINGS) ×2 IMPLANT
STOCKINETTE 4X48 STRL (DRAPES) ×2 IMPLANT
STRIP CLOSURE SKIN 1/4X4 (GAUZE/BANDAGES/DRESSINGS) ×4 IMPLANT
SUT ETHILON 4 0 PS 2 18 (SUTURE) IMPLANT
SUT MNCRL AB 4-0 PS2 18 (SUTURE) ×2 IMPLANT
SUT VIC AB 3-0 FS2 27 (SUTURE) ×2 IMPLANT
SYR BULB 3OZ (MISCELLANEOUS) ×2 IMPLANT
SYR CONTROL 10ML LL (SYRINGE) ×2 IMPLANT
TOWEL OR 17X24 6PK STRL BLUE (TOWEL DISPOSABLE) ×4 IMPLANT
UNDERPAD 30X30 INCONTINENT (UNDERPADS AND DIAPERS) ×2 IMPLANT
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
## 2012-07-31 NOTE — Anesthesia Procedure Notes (Signed)
Procedure Name: LMA Insertion Date/Time: 07/31/2012 10:16 AM Performed by: Zenia Resides D Pre-anesthesia Checklist: Patient identified, Emergency Drugs available, Suction available and Patient being monitored Patient Re-evaluated:Patient Re-evaluated prior to inductionOxygen Delivery Method: Circle System Utilized Preoxygenation: Pre-oxygenation with 100% oxygen Intubation Type: IV induction Ventilation: Mask ventilation without difficulty LMA: LMA inserted LMA Size: 4.0 Number of attempts: 1 Airway Equipment and Method: bite block Placement Confirmation: positive ETCO2 and breath sounds checked- equal and bilateral Tube secured with: Tape Dental Injury: Teeth and Oropharynx as per pre-operative assessment

## 2012-07-31 NOTE — Anesthesia Preprocedure Evaluation (Signed)
Anesthesia Evaluation  Patient identified by MRN, date of birth, ID band Patient awake    Reviewed: Allergy & Precautions, H&P , NPO status , Patient's Chart, lab work & pertinent test results  History of Anesthesia Complications (+) PONV  Airway Mallampati: I TM Distance: >3 FB Neck ROM: Full    Dental  (+) Teeth Intact and Dental Advisory Given   Pulmonary  breath sounds clear to auscultation        Cardiovascular Rhythm:Regular Rate:Normal     Neuro/Psych    GI/Hepatic   Endo/Other    Renal/GU      Musculoskeletal   Abdominal   Peds  Hematology   Anesthesia Other Findings   Reproductive/Obstetrics                           Anesthesia Physical Anesthesia Plan  ASA: I  Anesthesia Plan: General   Post-op Pain Management:    Induction: Intravenous  Airway Management Planned: LMA  Additional Equipment:   Intra-op Plan:   Post-operative Plan: Extubation in OR  Informed Consent: I have reviewed the patients History and Physical, chart, labs and discussed the procedure including the risks, benefits and alternatives for the proposed anesthesia with the patient or authorized representative who has indicated his/her understanding and acceptance.   Dental advisory given  Plan Discussed with: CRNA, Anesthesiologist and Surgeon  Anesthesia Plan Comments:         Anesthesia Quick Evaluation  
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
## 2012-07-31 NOTE — Transfer of Care (Signed)
Immediate Anesthesia Transfer of Care Note  Patient: Tracey [name]  Procedure(s) Performed: Procedure(s): REMOVAL HARDWARE LEFT OLECRANON  (Left)  Patient Location: PACU  Anesthesia Type:General  Level of Consciousness: awake, alert  and oriented  Airway & Oxygen Therapy: Patient Spontanous Breathing and Patient connected to face mask oxygen  Post-op Assessment: Report given to PACU RN and Post -op Vital signs reviewed and stable  Post vital signs: Reviewed and stable  Complications: No apparent anesthesia complications

## 2012-08-01 ENCOUNTER — Encounter (HOSPITAL_BASED_OUTPATIENT_CLINIC_OR_DEPARTMENT_OTHER): Payer: Self-pay | Admitting: Orthopedic Surgery
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
## 2013-01-29 ENCOUNTER — Ambulatory Visit (INDEPENDENT_AMBULATORY_CARE_PROVIDER_SITE_OTHER): Payer: BC Managed Care – PPO | Admitting: Internal Medicine

## 2013-01-29 ENCOUNTER — Encounter: Payer: Self-pay | Admitting: Internal Medicine

## 2013-01-29 VITALS — BP 102/70 | HR 76 | Temp 98.0°F | Ht 68.0 in | Wt 137.0 lb

## 2013-01-29 DIAGNOSIS — R300 Dysuria: Secondary | ICD-10-CM | POA: Insufficient documentation

## 2013-01-29 LAB — POCT URINALYSIS DIPSTICK
Blood, UA: NEGATIVE
Glucose, UA: NEGATIVE
Leukocytes, UA: NEGATIVE
Nitrite, UA: NEGATIVE
Urobilinogen, UA: NEGATIVE
pH, UA: 5.5

## 2013-01-29 MED ORDER — LEVOFLOXACIN 250 MG PO TABS: 250.0000 mg | ORAL_TABLET | Freq: Every day | ORAL | Status: DC

## 2013-01-29 NOTE — Patient Instructions (Signed)
Please take all new medication as prescribed - the antibiotic Please continue all other medications as before  Your specimen will be sent for the culture, which should take up to 2-3 days to complete.  Please check Mychart in about 2-4 days as we can often have your results available at that time.  Have a good time in [place].
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
## 2013-01-30 ENCOUNTER — Other Ambulatory Visit (INDEPENDENT_AMBULATORY_CARE_PROVIDER_SITE_OTHER): Payer: BC Managed Care – PPO

## 2013-01-30 DIAGNOSIS — R300 Dysuria: Secondary | ICD-10-CM

## 2013-01-30 LAB — URINALYSIS, ROUTINE W REFLEX MICROSCOPIC
Ketones, ur: NEGATIVE
Urine Glucose: NEGATIVE
Urobilinogen, UA: 0.2 (ref 0.0–1.0)

## 2013-02-01 LAB — URINE CULTURE: Colony Count: 40000

## 2013-02-01 NOTE — Assessment & Plan Note (Signed)
C/w prob cystitis, for urine studies, empiric antibx, f/u cx results

## 2013-02-05 ENCOUNTER — Other Ambulatory Visit: Payer: Self-pay

## 2013-04-02 LAB — HM MAMMOGRAPHY

## 2013-05-20 ENCOUNTER — Other Ambulatory Visit: Payer: Self-pay | Admitting: Family Medicine

## 2013-05-20 ENCOUNTER — Telehealth: Payer: Self-pay | Admitting: Internal Medicine

## 2013-05-20 DIAGNOSIS — Z0000 Encounter for general adult medical examination without abnormal findings: Secondary | ICD-10-CM

## 2013-05-20 DIAGNOSIS — D649 Anemia, unspecified: Secondary | ICD-10-CM

## 2013-05-20 NOTE — Telephone Encounter (Signed)
Orders linked.

## 2013-05-20 NOTE — Telephone Encounter (Signed)
Pt coming for cpe labs in the am.states her vit d was low last time and would like to have that added to labs. Is that OK?

## 2013-05-20 NOTE — Telephone Encounter (Signed)
I do not see where the pt has had a vitamin d level. However, she did have her IBC and ferritin checked last time and they were abnormal.  I ordered these along with the regular CPE labs.

## 2013-05-21 ENCOUNTER — Other Ambulatory Visit: Payer: BC Managed Care – PPO

## 2013-05-21 ENCOUNTER — Other Ambulatory Visit (INDEPENDENT_AMBULATORY_CARE_PROVIDER_SITE_OTHER): Payer: 59

## 2013-05-21 DIAGNOSIS — E559 Vitamin D deficiency, unspecified: Secondary | ICD-10-CM

## 2013-05-21 DIAGNOSIS — D649 Anemia, unspecified: Secondary | ICD-10-CM

## 2013-05-21 DIAGNOSIS — Z0000 Encounter for general adult medical examination without abnormal findings: Secondary | ICD-10-CM

## 2013-05-21 LAB — BASIC METABOLIC PANEL
BUN: 13 mg/dL (ref 6–23)
CHLORIDE: 105 meq/L (ref 96–112)
CO2: 25 mEq/L (ref 19–32)
Calcium: 9.1 mg/dL (ref 8.4–10.5)
Creatinine, Ser: 0.6 mg/dL (ref 0.4–1.2)
GFR: 118.93 mL/min (ref >60.00)
Glucose, Bld: 82 mg/dL (ref 70–99)
POTASSIUM: 4.2 meq/L (ref 3.5–5.1)
SODIUM: 137 meq/L (ref 135–145)

## 2013-05-21 LAB — CBC WITH DIFFERENTIAL/PLATELET
BASOS PCT: 0.6 % (ref 0.0–3.0)
Basophils Absolute: 0 10*3/uL (ref 0.0–0.1)
EOS ABS: 0.4 10*3/uL (ref 0.0–0.7)
EOS PCT: 7 % — AB (ref 0.0–5.0)
HEMATOCRIT: 39 % (ref 36.0–46.0)
Hemoglobin: 12.5 g/dL (ref 12.0–15.0)
LYMPHS ABS: 1.4 10*3/uL (ref 0.7–4.0)
Lymphocytes Relative: 25.8 % (ref 12.0–46.0)
MCHC: 32 g/dL (ref 30.0–36.0)
MCV: 92.7 fl (ref 78.0–100.0)
MONO ABS: 0.3 10*3/uL (ref 0.1–1.0)
Monocytes Relative: 5.5 % (ref 3.0–12.0)
NEUTROS PCT: 61.1 % (ref 43.0–77.0)
Neutro Abs: 3.4 10*3/uL (ref 1.4–7.7)
PLATELETS: 266 10*3/uL (ref 150.0–400.0)
RBC: 4.2 Mil/uL (ref 3.87–5.11)
RDW: 13.6 % (ref 11.5–14.6)
WBC: 5.5 10*3/uL (ref 4.5–10.5)

## 2013-05-21 LAB — IBC PANEL
Iron: 23 ug/dL — ABNORMAL LOW (ref 42–145)
SATURATION RATIOS: 5.8 % — AB (ref 20.0–50.0)
Transferrin: 283.8 mg/dL (ref 212.0–360.0)

## 2013-05-21 LAB — HEPATIC FUNCTION PANEL
ALT: 22 U/L (ref 0–35)
AST: 21 U/L (ref 0–37)
Albumin: 4 g/dL (ref 3.5–5.2)
Alkaline Phosphatase: 42 U/L (ref 39–117)
BILIRUBIN DIRECT: 0 mg/dL (ref 0.0–0.3)
BILIRUBIN TOTAL: 0.3 mg/dL (ref 0.3–1.2)
Total Protein: 6.9 g/dL (ref 6.0–8.3)

## 2013-05-21 LAB — LDL CHOLESTEROL, DIRECT: Direct LDL: 118.3 mg/dL

## 2013-05-21 LAB — LIPID PANEL
CHOL/HDL RATIO: 2
Cholesterol: 210 mg/dL — ABNORMAL HIGH (ref 0–200)
HDL: 84.2 mg/dL (ref >39.00)
TRIGLYCERIDES: 31 mg/dL (ref 0.0–149.0)
VLDL: 6.2 mg/dL (ref 0.0–40.0)

## 2013-05-21 LAB — FERRITIN: FERRITIN: 11.3 ng/mL (ref 10.0–291.0)

## 2013-05-21 LAB — TSH: TSH: 2.08 u[IU]/mL (ref 0.35–5.50)

## 2013-05-22 LAB — VITAMIN D 25 HYDROXY (VIT D DEFICIENCY, FRACTURES): VIT D 25 HYDROXY: 42 ng/mL (ref 30–89)

## 2013-05-26 ENCOUNTER — Encounter: Payer: BC Managed Care – PPO | Admitting: Internal Medicine

## 2013-05-26 ENCOUNTER — Ambulatory Visit (INDEPENDENT_AMBULATORY_CARE_PROVIDER_SITE_OTHER): Payer: 59 | Admitting: Internal Medicine

## 2013-05-26 ENCOUNTER — Encounter: Payer: Self-pay | Admitting: Internal Medicine

## 2013-05-26 VITALS — BP 98/62 | HR 63 | Temp 98.0°F | Ht 68.0 in | Wt 138.0 lb

## 2013-05-26 DIAGNOSIS — Z0000 Encounter for general adult medical examination without abnormal findings: Secondary | ICD-10-CM

## 2013-05-26 DIAGNOSIS — D509 Iron deficiency anemia, unspecified: Secondary | ICD-10-CM

## 2013-05-26 DIAGNOSIS — Z87898 Personal history of other specified conditions: Secondary | ICD-10-CM

## 2013-05-26 DIAGNOSIS — Z8669 Personal history of other diseases of the nervous system and sense organs: Secondary | ICD-10-CM

## 2013-05-26 DIAGNOSIS — E611 Iron deficiency: Secondary | ICD-10-CM | POA: Insufficient documentation

## 2013-05-26 MED ORDER — ZOLPIDEM TARTRATE 10 MG PO TABS: 5.0000 mg | ORAL_TABLET | Freq: Every evening | ORAL | Status: DC | PRN

## 2013-05-26 NOTE — Progress Notes (Signed)
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 immunocompromised condition through the age of 95 years if she did not get any or all doses earlier. During the 3-dose series, the second dose should be obtained 4 8 weeks after the first dose. The third dose should be obtained 24 weeks after the first dose and 16 weeks after the second dose.  Zoster vaccine. One dose is recommended for adults aged 20 years or older unless certain conditions are present.  Measles, mumps, and rubella (MMR) vaccine. Adults born before [birth year] generally are considered immune to measles and mumps. Adults born in [birth year] or later should have 1 or more doses of MMR vaccine unless there is a contraindication to the vaccine or there is laboratory evidence of immunity to each of the three diseases. A routine second dose of MMR vaccine should be obtained at least 28 days after the first dose for students attending postsecondary schools, health care workers, or international travelers. People who received inactivated measles vaccine or an unknown type of measles vaccine during 1963 1967 should receive 2 doses of MMR vaccine.  People who received inactivated mumps vaccine or an unknown type of mumps vaccine before 1979 and are at high risk for mumps infection should consider immunization with 2 doses of MMR vaccine. For females of childbearing age, rubella immunity should be determined. If there is no evidence of immunity, females who are not pregnant should be vaccinated. If there is no evidence of immunity, females who are pregnant should delay immunization until after pregnancy. Unvaccinated health care workers born before [birth year] who lack laboratory evidence of measles, mumps, or rubella immunity or laboratory confirmation of disease should consider measles and mumps immunization with 2 doses of MMR vaccine or rubella immunization with 1 dose of MMR vaccine.  Pneumococcal 13-valent conjugate (PCV13) vaccine. When indicated, a person who is uncertain of her immunization history and has no record of immunization should receive the PCV13 vaccine. An adult aged 64 years or older who has certain medical conditions and has not been previously immunized should receive 1 dose of PCV13 vaccine. This PCV13 should be followed with a dose of pneumococcal polysaccharide (PPSV23) vaccine. The PPSV23 vaccine dose should be obtained at least 8 weeks after the dose of PCV13 vaccine. An adult aged 67 years or older who has certain medical conditions and previously received 1 or more doses of PPSV23 vaccine should receive 1 dose of PCV13. The PCV13 vaccine dose should be obtained 1 or more years after the last PPSV23 vaccine dose.  Pneumococcal polysaccharide (PPSV23) vaccine. When PCV13 is also indicated, PCV13 should be obtained first. All adults aged 29 years and older should be immunized. An adult younger than age 26 years who has certain medical conditions should be immunized. Any person who resides in a nursing home or long-term care facility should be immunized. An adult smoker should be immunized. People with an immunocompromised condition and  certain other conditions should receive both PCV13 and PPSV23 vaccines. People with human immunodeficiency virus (HIV) infection should be immunized as soon as possible  after diagnosis. Immunization during chemotherapy or radiation therapy should be avoided. Routine use of PPSV23 vaccine is not recommended for American Indians, [place] Natives, or people younger than 65 years unless there are medical conditions that require PPSV23 vaccine. When indicated, people who have unknown immunization and have no record of immunization should receive PPSV23 vaccine. One-time revaccination 5 years after the first dose of PPSV23 is recommended for people aged 98 64 years who have chronic kidney failure, nephrotic syndrome, asplenia, or immunocompromised conditions. People who received 1 2 doses of PPSV23 before age 65 years should receive another dose of PPSV23 vaccine at age 33 years or later if at least 5 years have passed since the previous dose. Doses of PPSV23 are not needed for people immunized with PPSV23 at or after age 33 years.  Meningococcal vaccine. Adults with asplenia or persistent complement component deficiencies should receive 2 doses of quadrivalent meningococcal conjugate (MenACWY-D) vaccine. The doses should be obtained at least 2 months apart. Microbiologists working with certain meningococcal bacteria, [employer] recruits, people at risk during an outbreak, and people who travel to or live in countries with a high rate of meningitis should be immunized. A first-year college student up through age 67 years who is living in a residence hall should receive a dose if she did not receive a dose on or after her 16th birthday. Adults who have certain high-risk conditions should receive one or more doses of vaccine.  Hepatitis A vaccine. Adults who wish to be protected from this disease, have certain high-risk conditions, work with hepatitis A-infected animals, work in hepatitis A research labs, or travel to or  work in countries with a high rate of hepatitis A should be immunized. Adults who were previously unvaccinated and who anticipate close contact with an international adoptee during the first 60 days after arrival in the [place] from a country with a high rate of hepatitis A should be immunized.  Hepatitis B vaccine. Adults who wish to be protected from this disease, have certain high-risk conditions, may be exposed to blood or other infectious body fluids, are household contacts or sex partners of hepatitis B positive people, are clients or workers in certain care facilities, or travel to or work in countries with a high rate of hepatitis B should be immunized.  Haemophilus influenzae type b (Hib) vaccine. A previously unvaccinated person with asplenia or sickle cell disease or having a scheduled splenectomy should receive 1 dose of Hib vaccine. Regardless of previous immunization, a recipient of a hematopoietic stem cell transplant should receive a 3-dose series 6 12 months after her successful transplant. Hib vaccine is not recommended for adults with HIV infection. Preventive Services / Frequency   Ages 40 to 64years  Blood pressure check.** / Every 1 to 2 years.  Lipid and cholesterol check.** / Every 5 years beginning at age 54 years.  Lung cancer screening. / Every year if you are aged 66 80 years and have a 30-pack-year history of smoking and currently smoke or have quit within the past 15 years. Yearly screening is stopped once you have quit smoking for at least 15 years or develop a health problem that would prevent you from having lung cancer treatment.  Clinical breast exam.** / Every year after age 7 years.  BRCA-related cancer risk assessment.** / For women who have family members with a BRCA-related cancer (breast, ovarian, tubal, or peritoneal cancers).  Mammogram.** / Every year beginning at age 73 years and continuing for as long  as you are in good health. Consult with  your health care provider.  Pap test.** / Every 3 years starting at age 66 years through age 66 or 65 years with a history of 3 consecutive normal Pap tests.  HPV screening.** / Every 3 years from ages 69 years through ages 26 to 63 years with a history of 3 consecutive normal Pap tests.  Fecal occult blood test (FOBT) of stool. / Every year beginning at age 87 years and continuing until age 50 years. You may not need to do this test if you get a colonoscopy every 10 years.  Flexible sigmoidoscopy or colonoscopy.** / Every 5 years for a flexible sigmoidoscopy or every 10 years for a colonoscopy beginning at age 57 years and continuing until age 52 years.  Hepatitis C blood test.** / For all people born from [birth year] through 1963 and any individual with known risks for hepatitis C.  Skin self-exam. / Monthly.  Influenza vaccine. / Every year.  Tetanus, diphtheria, and acellular pertussis (Tdap/Td) vaccine.** / Consult your health care provider. Pregnant women should receive 1 dose of Tdap vaccine during each pregnancy. 1 dose of Td every 10 years.  Varicella vaccine.** / Consult your health care provider. Pregnant females who do not have evidence of immunity should receive the first dose after pregnancy.  Zoster vaccine.** / 1 dose for adults aged 4 years or older.  Measles, mumps, rubella (MMR) vaccine.** / You need at least 1 dose of MMR if you were born in 1957 or later. You may also need a 2nd dose. For females of childbearing age, rubella immunity should be determined. If there is no evidence of immunity, females who are not pregnant should be vaccinated. If there is no evidence of immunity, females who are pregnant should delay immunization until after pregnancy.  Pneumococcal 13-valent conjugate (PCV13) vaccine.** / Consult your health care provider.  Pneumococcal polysaccharide (PPSV23) vaccine.** / 1 to 2 doses if you smoke cigarettes or if you have certain conditions.  Meningococcal  vaccine.** / Consult your health care provider.  Hepatitis A vaccine.** / Consult your health care provider.  Hepatitis B vaccine.** / Consult your health care provider.  Haemophilus influenzae type b (Hib) vaccine.** / Consult your health care provider. Ages 87 years and over  Blood pressure check.** / Every 1 to 2 years.  Lipid and cholesterol check.** / Every 5 years beginning at age 95 years.  Lung cancer screening. / Every year if you are aged 50 80 years and have a 30-pack-year history of smoking and currently smoke or have quit within the past 15 years. Yearly screening is stopped once you have quit smoking for at least 15 years or develop a health problem that would prevent you from having lung cancer treatment.  Clinical breast exam.** / Every year after age 47 years.  BRCA-related cancer risk assessment.** / For women who have family members with a BRCA-related cancer (breast, ovarian, tubal, or peritoneal cancers).  Mammogram.** / Every year beginning at age 79 years and continuing for as long as you are in good health. Consult with your health care provider.  Pap test.** / Every 3 years starting at age 52 years through age 35 or 24 years with 3 consecutive normal Pap tests. Testing can be stopped between 65 and 70 years with 3 consecutive normal Pap tests and no abnormal Pap or HPV tests in the past 10 years.  HPV screening.** / Every 3 years from ages 68 years through ages  65 or 70 years with a history of 3 consecutive normal Pap tests. Testing can be stopped between 65 and 70 years with 3 consecutive normal Pap tests and no abnormal Pap or HPV tests in the past 10 years.  Fecal occult blood test (FOBT) of stool. / Every year beginning at age 63 years and continuing until age 63 years. You may not need to do this test if you get a colonoscopy every 10 years.  Flexible sigmoidoscopy or colonoscopy.** / Every 5 years for a flexible sigmoidoscopy or every 10 years for a  colonoscopy beginning at age 2 years and continuing until age 66 years.  Hepatitis C blood test.** / For all people born from [birth year] through 1963 and any individual with known risks for hepatitis C.  Osteoporosis screening.** / A one-time screening for women ages 59 years and over and women at risk for fractures or osteoporosis.  Skin self-exam. / Monthly.  Influenza vaccine. / Every year.  Tetanus, diphtheria, and acellular pertussis (Tdap/Td) vaccine.** / 1 dose of Td every 10 years.  Varicella vaccine.** / Consult your health care provider.  Zoster vaccine.** / 1 dose for adults aged 59 years or older.  Pneumococcal 13-valent conjugate (PCV13) vaccine.** / Consult your health care provider.  Pneumococcal polysaccharide (PPSV23) vaccine.** / 1 dose for all adults aged 52 years and older.  Meningococcal vaccine.** / Consult your health care provider.  Hepatitis A vaccine.** / Consult your health care provider.  Hepatitis B vaccine.** / Consult your health care provider.  Haemophilus influenzae type b (Hib) vaccine.** / Consult your health care provider. ** Family history and personal history of risk and conditions may change your health care provider's recommendations. Document Released: 05/15/2001 Document Revised: 01/07/2013 Document Reviewed: 08/14/2010 [facility] Patient Information 2014 [place], [place].     Standley Brooking. Annibelle Brazie M.D.

## 2013-05-26 NOTE — Progress Notes (Signed)
Pre visit review using our clinic review tool, if applicable. No additional management support is needed unless otherwise documented below in the visit note. 

## 2013-05-26 NOTE — Patient Instructions (Addendum)
You are not anemic but  Iron deficient . Continue iron supplement  As long as you are having periods .  Labs are good today. Avoid regular use of sleep aids.  Such as Lorrin Mais as they can be habit forming  Will rx x 1 today  Further refills via dr Matthew Saras.     Insomnia Insomnia is frequent trouble falling and/or staying asleep. Insomnia can be a long term problem or a short term problem. Both are common. Insomnia can be a short term problem when the wakefulness is related to a certain stress or worry. Long term insomnia is often related to ongoing stress during waking hours and/or poor sleeping habits. Overtime, sleep deprivation itself can make the problem worse. Every little thing feels more severe because you are overtired and your ability to cope is decreased. CAUSES   Stress, anxiety, and depression.  Poor sleeping habits.  Distractions such as TV in the bedroom.  Naps close to bedtime.  Engaging in emotionally charged conversations before bed.  Technical reading before sleep.  Alcohol and other sedatives. They may make the problem worse. They can hurt normal sleep patterns and normal dream activity.  Stimulants such as caffeine for several hours prior to bedtime.  Pain syndromes and shortness of breath can cause insomnia.  Exercise late at night.  Changing time zones may cause sleeping problems (jet lag). It is sometimes helpful to have someone observe your sleeping patterns. They should look for periods of not breathing during the night (sleep apnea). They should also look to see how long those periods last. If you live alone or observers are uncertain, you can also be observed at a sleep clinic where your sleep patterns will be professionally monitored. Sleep apnea requires a checkup and treatment. Give your caregivers your medical history. Give your caregivers observations your family has made about your sleep.  SYMPTOMS   Not feeling rested in the morning.  Anxiety and  restlessness at bedtime.  Difficulty falling and staying asleep. TREATMENT   Your caregiver may prescribe treatment for an underlying medical disorders. Your caregiver can give advice or help if you are using alcohol or other drugs for self-medication. Treatment of underlying problems will usually eliminate insomnia problems.  Medications can be prescribed for short time use. They are generally not recommended for lengthy use.  Over-the-counter sleep medicines are not recommended for lengthy use. They can be habit forming.  You can promote easier sleeping by making lifestyle changes such as:  Using relaxation techniques that help with breathing and reduce muscle tension.  Exercising earlier in the day.  Changing your diet and the time of your last meal. No night time snacks.  Establish a regular time to go to bed.  Counseling can help with stressful problems and worry.  Soothing music and white noise may be helpful if there are background noises you cannot remove.  Stop tedious detailed work at least one hour before bedtime. HOME CARE INSTRUCTIONS   Keep a diary. Inform your caregiver about your progress. This includes any medication side effects. See your caregiver regularly. Take note of:  Times when you are asleep.  Times when you are awake during the night.  The quality of your sleep.  How you feel the next day. This information will help your caregiver care for you.  Get out of bed if you are still awake after 15 minutes. Read or do some quiet activity. Keep the lights down. Wait until you feel sleepy and go back  to bed.  Keep regular sleeping and waking hours. Avoid naps.  Exercise regularly.  Avoid distractions at bedtime. Distractions include watching television or engaging in any intense or detailed activity like attempting to balance the household checkbook.  Develop a bedtime ritual. Keep a familiar routine of bathing, brushing your teeth, climbing into bed  at the same time each night, listening to soothing music. Routines increase the success of falling to sleep faster.  Use relaxation techniques. This can be using breathing and muscle tension release routines. It can also include visualizing peaceful scenes. You can also help control troubling or intruding thoughts by keeping your mind occupied with boring or repetitive thoughts like the old concept of counting sheep. You can make it more creative like imagining planting one beautiful flower after another in your backyard garden.  During your day, work to eliminate stress. When this is not possible use some of the previous suggestions to help reduce the anxiety that accompanies stressful situations. MAKE SURE YOU:   Understand these instructions.  Will watch your condition.  Will get help right away if you are not doing well or get worse. Document Released: 03/16/2000 Document Revised: 06/11/2011 Document Reviewed: 04/16/2007 [facility] Patient Information 2014 [place].  Preventive Care for Adults, Female A healthy lifestyle and preventive care can promote health and wellness. Preventive health guidelines for women include the following key practices.  A routine yearly physical is a good way to check with your health care provider about your health and preventive screening. It is a chance to share any concerns and updates on your health and to receive a thorough exam.  Visit your dentist for a routine exam and preventive care every 6 months. Brush your teeth twice a day and floss once a day. Good oral hygiene prevents tooth decay and gum disease.  The frequency of eye exams is based on your age, health, family medical history, use of contact lenses, and other factors. Follow your health care provider's recommendations for frequency of eye exams.  Eat a healthy diet. Foods like vegetables, fruits, whole grains, low-fat dairy products, and lean protein foods contain the nutrients you need  without too many calories. Decrease your intake of foods high in solid fats, added sugars, and salt. Eat the right amount of calories for you.Get information about a proper diet from your health care provider, if necessary.  Regular physical exercise is one of the most important things you can do for your health. Most adults should get at least 150 minutes of moderate-intensity exercise (any activity that increases your heart rate and causes you to sweat) each week. In addition, most adults need muscle-strengthening exercises on 2 or more days a week.  Maintain a healthy weight. The body mass index (BMI) is a screening tool to identify possible weight problems. It provides an estimate of body fat based on height and weight. Your health care provider can find your BMI, and can help you achieve or maintain a healthy weight.For adults 20 years and older:  A BMI below 18.5 is considered underweight.  A BMI of 18.5 to 24.9 is normal.  A BMI of 25 to 29.9 is considered overweight.  A BMI of 30 and above is considered obese.  Maintain normal blood lipids and cholesterol levels by exercising and minimizing your intake of saturated fat. Eat a balanced diet with plenty of fruit and vegetables. Blood tests for lipids and cholesterol should begin at age 63 and be repeated every 5 years. If your  lipid or cholesterol levels are high, you are over 50, or you are at high risk for heart disease, you may need your cholesterol levels checked more frequently.Ongoing high lipid and cholesterol levels should be treated with medicines if diet and exercise are not working.  If you smoke, find out from your health care provider how to quit. If you do not use tobacco, do not start.  Lung cancer screening is recommended for adults aged 35 80 years who are at high risk for developing lung cancer because of a history of smoking. A yearly low-dose CT scan of the lungs is recommended for people who have at least a  30-pack-year history of smoking and are a current smoker or have quit within the past 15 years. A pack year of smoking is smoking an average of 1 pack of cigarettes a day for 1 year (for example: 1 pack a day for 30 years or 2 packs a day for 15 years). Yearly screening should continue until the smoker has stopped smoking for at least 15 years. Yearly screening should be stopped for people who develop a health problem that would prevent them from having lung cancer treatment.  If you are pregnant, do not drink alcohol. If you are breastfeeding, be very cautious about drinking alcohol. If you are not pregnant and choose to drink alcohol, do not have more than 1 drink per day. One drink is considered to be 12 ounces (355 mL) of beer, 5 ounces (148 mL) of wine, or 1.5 ounces (44 mL) of liquor.  Avoid use of street drugs. Do not share needles with anyone. Ask for help if you need support or instructions about stopping the use of drugs.  High blood pressure causes heart disease and increases the risk of stroke. Your blood pressure should be checked at least every 1 to 2 years. Ongoing high blood pressure should be treated with medicines if weight loss and exercise do not work.  If you are 33 51 years old, ask your health care provider if you should take aspirin to prevent strokes.  Diabetes screening involves taking a blood sample to check your fasting blood sugar level. This should be done once every 3 years, after age 80, if you are within normal weight and without risk factors for diabetes. Testing should be considered at a younger age or be carried out more frequently if you are overweight and have at least 1 risk factor for diabetes.  Breast cancer screening is essential preventive care for women. You should practice "breast self-awareness." This means understanding the normal appearance and feel of your breasts and may include breast self-examination. Any changes detected, no matter how small, should be  reported to a health care provider. Women in their 51s and 30s should have a clinical breast exam (CBE) by a health care provider as part of a regular health exam every 1 to 3 years. After age 9, women should have a CBE every year. Starting at age 78, women should consider having a mammogram (breast X-ray test) every year. Women who have a family history of breast cancer should talk to their health care provider about genetic screening. Women at a high risk of breast cancer should talk to their health care providers about having an MRI and a mammogram every year.  Breast cancer gene (BRCA)-related cancer risk assessment is recommended for women who have family members with BRCA-related cancers. BRCA-related cancers include breast, ovarian, tubal, and peritoneal cancers. Having family members with these cancers  may be associated with an increased risk for harmful changes (mutations) in the breast cancer genes BRCA1 and BRCA2. Results of the assessment will determine the need for genetic counseling and BRCA1 and BRCA2 testing.  The Pap test is a screening test for cervical cancer. A Pap test can show cell changes on the cervix that might become cervical cancer if left untreated. A Pap test is a procedure in which cells are obtained and examined from the lower end of the uterus (cervix).  Women should have a Pap test starting at age 55.  Between ages 63 and 61, Pap tests should be repeated every 2 years.  Beginning at age 25, you should have a Pap test every 3 years as long as the past 3 Pap tests have been normal.  Some women have medical problems that increase the chance of getting cervical cancer. Talk to your health care provider about these problems. It is especially important to talk to your health care provider if a new problem develops soon after your last Pap test. In these cases, your health care provider may recommend more frequent screening and Pap tests.  The above recommendations are the  same for women who have or have not gotten the vaccine for human papillomavirus (HPV).  If you had a hysterectomy for a problem that was not cancer or a condition that could lead to cancer, then you no longer need Pap tests. Even if you no longer need a Pap test, a regular exam is a good idea to make sure no other problems are starting.  If you are between ages 40 and 65 years, and you have had normal Pap tests going back 10 years, you no longer need Pap tests. Even if you no longer need a Pap test, a regular exam is a good idea to make sure no other problems are starting.  If you have had past treatment for cervical cancer or a condition that could lead to cancer, you need Pap tests and screening for cancer for at least 20 years after your treatment.  If Pap tests have been discontinued, risk factors (such as a new sexual partner) need to be reassessed to determine if screening should be resumed.  The HPV test is an additional test that may be used for cervical cancer screening. The HPV test looks for the virus that can cause the cell changes on the cervix. The cells collected during the Pap test can be tested for HPV. The HPV test could be used to screen women aged 64 years and older, and should be used in women of any age who have unclear Pap test results. After the age of 21, women should have HPV testing at the same frequency as a Pap test.  Colorectal cancer can be detected and often prevented. Most routine colorectal cancer screening begins at the age of 62 years and continues through age 70 years. However, your health care provider may recommend screening at an earlier age if you have risk factors for colon cancer. On a yearly basis, your health care provider may provide home test kits to check for hidden blood in the stool. Use of a small camera at the end of a tube, to directly examine the colon (sigmoidoscopy or colonoscopy), can detect the earliest forms of colorectal cancer. Talk to your  health care provider about this at age 12, when routine screening begins. Direct exam of the colon should be repeated every 5 10 years through age 34 years, unless early  forms of pre-cancerous polyps or small growths are found.  People who are at an increased risk for hepatitis B should be screened for this virus. You are considered at high risk for hepatitis B if:  You were born in a country where hepatitis B occurs often. Talk with your health care provider about which countries are considered high risk.  Your parents were born in a high-risk country and you have not received a shot to protect against hepatitis B (hepatitis B vaccine).  You have HIV or AIDS.  You use needles to inject street drugs.  You live with, or have sex with, someone who has Hepatitis B.  You get hemodialysis treatment.  You take certain medicines for conditions like cancer, organ transplantation, and autoimmune conditions.  Hepatitis C blood testing is recommended for all people born from [birth year] through 1963 and any individual with known risks for hepatitis C.  Practice safe sex. Use condoms and avoid high-risk sexual practices to reduce the spread of sexually transmitted infections (STIs). STIs include gonorrhea, chlamydia, syphilis, trichomonas, herpes, HPV, and human immunodeficiency virus (HIV). Herpes, HIV, and HPV are viral illnesses that have no cure. They can result in disability, cancer, and death. Sexually active women aged 67 years and younger should be checked for chlamydia. Older women with new or multiple partners should also be tested for chlamydia. Testing for other STIs is recommended if you are sexually active and at increased risk.  Osteoporosis is a disease in which the bones lose minerals and strength with aging. This can result in serious bone fractures or breaks. The risk of osteoporosis can be identified using a bone density scan. Women ages 54 years and over and women at risk for fractures or  osteoporosis should discuss screening with their health care providers. Ask your health care provider whether you should take a calcium supplement or vitamin D to reduce the rate of osteoporosis.  Menopause can be associated with physical symptoms and risks. Hormone replacement therapy is available to decrease symptoms and risks. You should talk to your health care provider about whether hormone replacement therapy is right for you.  Use sunscreen. Apply sunscreen liberally and repeatedly throughout the day. You should seek shade when your shadow is shorter than you. Protect yourself by wearing long sleeves, pants, a wide-brimmed hat, and sunglasses year round, whenever you are outdoors.  Once a month, do a whole body skin exam, using a mirror to look at the skin on your back. Tell your health care provider of new moles, moles that have irregular borders, moles that are larger than a pencil eraser, or moles that have changed in shape or color.  Stay current with required vaccines (immunizations).  Influenza vaccine. All adults should be immunized every year.  Tetanus, diphtheria, and acellular pertussis (Td, Tdap) vaccine. Pregnant women should receive 1 dose of Tdap vaccine during each pregnancy. The dose should be obtained regardless of the length of time since the last dose. Immunization is preferred during the 27th 36th week of gestation. An adult who has not previously received Tdap or who does not know her vaccine status should receive 1 dose of Tdap. This initial dose should be followed by tetanus and diphtheria toxoids (Td) booster doses every 10 years. Adults with an unknown or incomplete history of completing a 3-dose immunization series with Td-containing vaccines should begin or complete a primary immunization series including a Tdap dose. Adults should receive a Td booster every 10 years.  Varicella vaccine. An adult  without evidence of immunity to varicella should receive 2 doses or a  second dose if she has previously received 1 dose. Pregnant females who do not have evidence of immunity should receive the first dose after pregnancy. This first dose should be obtained before leaving the health care facility. The second dose should be obtained 4 8 weeks after the first dose.  Human papillomavirus (HPV) vaccine. Females aged 37 26 years who have not received the vaccine previously should obtain the 3-dose series. The vaccine is not recommended for use in pregnant females. However, pregnancy testing is not needed before receiving a dose. If a female is found to be pregnant after receiving a dose, no treatment is needed. In that case, the remaining doses should be delayed until after the pregnancy. Immunization is recommended for any person with an immunocompromised condition through the age of 50 years if she did not get any or all doses earlier. During the 3-dose series, the second dose should be obtained 4 8 weeks after the first dose. The third dose should be obtained 24 weeks after the first dose and 16 weeks after the second dose.  Zoster vaccine. One dose is recommended for adults aged 33 years or older unless certain conditions are present.  Measles, mumps, and rubella (MMR) vaccine. Adults born before [birth year] generally are considered immune to measles and mumps. Adults born in [birth year] or later should have 1 or more doses of MMR vaccine unless there is a contraindication to the vaccine or there is laboratory evidence of immunity to each of the three diseases. A routine second dose of MMR vaccine should be obtained at least 28 days after the first dose for students attending postsecondary schools, health care workers, or international travelers. People who received inactivated measles vaccine or an unknown type of measles vaccine during 1963 1967 should receive 2 doses of MMR vaccine. People who received inactivated mumps vaccine or an unknown type of mumps vaccine before 1979 and are at high  risk for mumps infection should consider immunization with 2 doses of MMR vaccine. For females of childbearing age, rubella immunity should be determined. If there is no evidence of immunity, females who are not pregnant should be vaccinated. If there is no evidence of immunity, females who are pregnant should delay immunization until after pregnancy. Unvaccinated health care workers born before [birth year] who lack laboratory evidence of measles, mumps, or rubella immunity or laboratory confirmation of disease should consider measles and mumps immunization with 2 doses of MMR vaccine or rubella immunization with 1 dose of MMR vaccine.  Pneumococcal 13-valent conjugate (PCV13) vaccine. When indicated, a person who is uncertain of her immunization history and has no record of immunization should receive the PCV13 vaccine. An adult aged 39 years or older who has certain medical conditions and has not been previously immunized should receive 1 dose of PCV13 vaccine. This PCV13 should be followed with a dose of pneumococcal polysaccharide (PPSV23) vaccine. The PPSV23 vaccine dose should be obtained at least 8 weeks after the dose of PCV13 vaccine. An adult aged 44 years or older who has certain medical conditions and previously received 1 or more doses of PPSV23 vaccine should receive 1 dose of PCV13. The PCV13 vaccine dose should be obtained 1 or more years after the last PPSV23 vaccine dose.  Pneumococcal polysaccharide (PPSV23) vaccine. When PCV13 is also indicated, PCV13 should be obtained first. All adults aged 68 years and older should be immunized. An adult younger than age 42 years  who has certain medical conditions should be immunized. Any person who resides in a nursing home or long-term care facility should be immunized. An adult smoker should be immunized. People with an immunocompromised condition and certain other conditions should receive both PCV13 and PPSV23 vaccines. People with human immunodeficiency  virus (HIV) infection should be immunized as soon as possible after diagnosis. Immunization during chemotherapy or radiation therapy should be avoided. Routine use of PPSV23 vaccine is not recommended for American Indians, [place] Natives, or people younger than 65 years unless there are medical conditions that require PPSV23 vaccine. When indicated, people who have unknown immunization and have no record of immunization should receive PPSV23 vaccine. One-time revaccination 5 years after the first dose of PPSV23 is recommended for people aged 62 64 years who have chronic kidney failure, nephrotic syndrome, asplenia, or immunocompromised conditions. People who received 1 2 doses of PPSV23 before age 56 years should receive another dose of PPSV23 vaccine at age 71 years or later if at least 5 years have passed since the previous dose. Doses of PPSV23 are not needed for people immunized with PPSV23 at or after age 52 years.  Meningococcal vaccine. Adults with asplenia or persistent complement component deficiencies should receive 2 doses of quadrivalent meningococcal conjugate (MenACWY-D) vaccine. The doses should be obtained at least 2 months apart. Microbiologists working with certain meningococcal bacteria, [place] recruits, people at risk during an outbreak, and people who travel to or live in countries with a high rate of meningitis should be immunized. A first-year college student up through age 11 years who is living in a residence hall should receive a dose if she did not receive a dose on or after her 16th birthday. Adults who have certain high-risk conditions should receive one or more doses of vaccine.  Hepatitis A vaccine. Adults who wish to be protected from this disease, have certain high-risk conditions, work with hepatitis A-infected animals, work in hepatitis A research labs, or travel to or work in countries with a high rate of hepatitis A should be immunized. Adults who were previously  unvaccinated and who anticipate close contact with an international adoptee during the first 60 days after arrival in the [place] from a country with a high rate of hepatitis A should be immunized.  Hepatitis B vaccine. Adults who wish to be protected from this disease, have certain high-risk conditions, may be exposed to blood or other infectious body fluids, are household contacts or sex partners of hepatitis B positive people, are clients or workers in certain care facilities, or travel to or work in countries with a high rate of hepatitis B should be immunized.  Haemophilus influenzae type b (Hib) vaccine. A previously unvaccinated person with asplenia or sickle cell disease or having a scheduled splenectomy should receive 1 dose of Hib vaccine. Regardless of previous immunization, a recipient of a hematopoietic stem cell transplant should receive a 3-dose series 6 12 months after her successful transplant. Hib vaccine is not recommended for adults with HIV infection. Preventive Services / Frequency   Ages 47 to 64years  Blood pressure check.** / Every 1 to 2 years.  Lipid and cholesterol check.** / Every 5 years beginning at age 74 years.  Lung cancer screening. / Every year if you are aged 34 80 years and have a 30-pack-year history of smoking and currently smoke or have quit within the past 15 years. Yearly screening is stopped once you have quit smoking for at least 15 years or develop  a health problem that would prevent you from having lung cancer treatment.  Clinical breast exam.** / Every year after age 79 years.  BRCA-related cancer risk assessment.** / For women who have family members with a BRCA-related cancer (breast, ovarian, tubal, or peritoneal cancers).  Mammogram.** / Every year beginning at age 53 years and continuing for as long as you are in good health. Consult with your health care provider.  Pap test.** / Every 3 years starting at age 53 years through age 68 or  40 years with a history of 3 consecutive normal Pap tests.  HPV screening.** / Every 3 years from ages 3 years through ages 21 to 63 years with a history of 3 consecutive normal Pap tests.  Fecal occult blood test (FOBT) of stool. / Every year beginning at age 68 years and continuing until age 57 years. You may not need to do this test if you get a colonoscopy every 10 years.  Flexible sigmoidoscopy or colonoscopy.** / Every 5 years for a flexible sigmoidoscopy or every 10 years for a colonoscopy beginning at age 17 years and continuing until age 63 years.  Hepatitis C blood test.** / For all people born from [birth year] through 1963 and any individual with known risks for hepatitis C.  Skin self-exam. / Monthly.  Influenza vaccine. / Every year.  Tetanus, diphtheria, and acellular pertussis (Tdap/Td) vaccine.** / Consult your health care provider. Pregnant women should receive 1 dose of Tdap vaccine during each pregnancy. 1 dose of Td every 10 years.  Varicella vaccine.** / Consult your health care provider. Pregnant females who do not have evidence of immunity should receive the first dose after pregnancy.  Zoster vaccine.** / 1 dose for adults aged 59 years or older.  Measles, mumps, rubella (MMR) vaccine.** / You need at least 1 dose of MMR if you were born in 1957 or later. You may also need a 2nd dose. For females of childbearing age, rubella immunity should be determined. If there is no evidence of immunity, females who are not pregnant should be vaccinated. If there is no evidence of immunity, females who are pregnant should delay immunization until after pregnancy.  Pneumococcal 13-valent conjugate (PCV13) vaccine.** / Consult your health care provider.  Pneumococcal polysaccharide (PPSV23) vaccine.** / 1 to 2 doses if you smoke cigarettes or if you have certain conditions.  Meningococcal vaccine.** / Consult your health care provider.  Hepatitis A vaccine.** / Consult your health care  provider.  Hepatitis B vaccine.** / Consult your health care provider.  Haemophilus influenzae type b (Hib) vaccine.** / Consult your health care provider. Ages 36 years and over  Blood pressure check.** / Every 1 to 2 years.  Lipid and cholesterol check.** / Every 5 years beginning at age 44 years.  Lung cancer screening. / Every year if you are aged 19 80 years and have a 30-pack-year history of smoking and currently smoke or have quit within the past 15 years. Yearly screening is stopped once you have quit smoking for at least 15 years or develop a health problem that would prevent you from having lung cancer treatment.  Clinical breast exam.** / Every year after age 50 years.  BRCA-related cancer risk assessment.** / For women who have family members with a BRCA-related cancer (breast, ovarian, tubal, or peritoneal cancers).  Mammogram.** / Every year beginning at age 16 years and continuing for as long as you are in good health. Consult with your health care provider.  Pap test.** /  Every 3 years starting at age 65 years through age 93 or 8 years with 3 consecutive normal Pap tests. Testing can be stopped between 65 and 70 years with 3 consecutive normal Pap tests and no abnormal Pap or HPV tests in the past 10 years.  HPV screening.** / Every 3 years from ages 82 years through ages 63 or 6 years with a history of 3 consecutive normal Pap tests. Testing can be stopped between 65 and 70 years with 3 consecutive normal Pap tests and no abnormal Pap or HPV tests in the past 10 years.  Fecal occult blood test (FOBT) of stool. / Every year beginning at age 76 years and continuing until age 57 years. You may not need to do this test if you get a colonoscopy every 10 years.  Flexible sigmoidoscopy or colonoscopy.** / Every 5 years for a flexible sigmoidoscopy or every 10 years for a colonoscopy beginning at age 39 years and continuing until age 33 years.  Hepatitis C blood test.** / For  all people born from [birth year] through 1963 and any individual with known risks for hepatitis C.  Osteoporosis screening.** / A one-time screening for women ages 65 years and over and women at risk for fractures or osteoporosis.  Skin self-exam. / Monthly.  Influenza vaccine. / Every year.  Tetanus, diphtheria, and acellular pertussis (Tdap/Td) vaccine.** / 1 dose of Td every 10 years.  Varicella vaccine.** / Consult your health care provider.  Zoster vaccine.** / 1 dose for adults aged 15 years or older.  Pneumococcal 13-valent conjugate (PCV13) vaccine.** / Consult your health care provider.  Pneumococcal polysaccharide (PPSV23) vaccine.** / 1 dose for all adults aged 71 years and older.  Meningococcal vaccine.** / Consult your health care provider.  Hepatitis A vaccine.** / Consult your health care provider.  Hepatitis B vaccine.** / Consult your health care provider.  Haemophilus influenzae type b (Hib) vaccine.** / Consult your health care provider. ** Family history and personal history of risk and conditions may change your health care provider's recommendations. Document Released: 05/15/2001 Document Revised: 01/07/2013 Document Reviewed: 08/14/2010 [facility] [place] Patient Information 2014 [place], [place].
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
## 2014-02-01 ENCOUNTER — Encounter: Payer: Self-pay | Admitting: Internal Medicine

## 2014-04-13 ENCOUNTER — Other Ambulatory Visit: Payer: Self-pay | Admitting: Obstetrics and Gynecology

## 2014-04-14 LAB — CYTOLOGY - PAP

## 2014-05-26 ENCOUNTER — Other Ambulatory Visit (INDEPENDENT_AMBULATORY_CARE_PROVIDER_SITE_OTHER): Payer: 59

## 2014-05-26 DIAGNOSIS — Z0000 Encounter for general adult medical examination without abnormal findings: Secondary | ICD-10-CM

## 2014-05-26 LAB — CBC WITH DIFFERENTIAL/PLATELET
Basophils Absolute: 0 10*3/uL (ref 0.0–0.1)
Basophils Relative: 0.7 % (ref 0.0–3.0)
EOS PCT: 4.2 % (ref 0.0–5.0)
Eosinophils Absolute: 0.2 10*3/uL (ref 0.0–0.7)
HEMATOCRIT: 38.3 % (ref 36.0–46.0)
HEMOGLOBIN: 12.8 g/dL (ref 12.0–15.0)
LYMPHS ABS: 1.4 10*3/uL (ref 0.7–4.0)
LYMPHS PCT: 28.3 % (ref 12.0–46.0)
MCHC: 33.5 g/dL (ref 30.0–36.0)
MCV: 87.2 fl (ref 78.0–100.0)
MONOS PCT: 6.5 % (ref 3.0–12.0)
Monocytes Absolute: 0.3 10*3/uL (ref 0.1–1.0)
NEUTROS ABS: 2.9 10*3/uL (ref 1.4–7.7)
Neutrophils Relative %: 60.3 % (ref 43.0–77.0)
Platelets: 257 10*3/uL (ref 150.0–400.0)
RBC: 4.39 Mil/uL (ref 3.87–5.11)
RDW: 16.6 % — ABNORMAL HIGH (ref 11.5–15.5)
WBC: 4.8 10*3/uL (ref 4.0–10.5)

## 2014-05-26 LAB — HEPATIC FUNCTION PANEL
ALBUMIN: 4 g/dL (ref 3.5–5.2)
ALK PHOS: 50 U/L (ref 39–117)
ALT: 17 U/L (ref 0–35)
AST: 22 U/L (ref 0–37)
Bilirubin, Direct: 0.1 mg/dL (ref 0.0–0.3)
Total Bilirubin: 0.5 mg/dL (ref 0.2–1.2)
Total Protein: 7.1 g/dL (ref 6.0–8.3)

## 2014-05-26 LAB — LIPID PANEL
CHOLESTEROL: 184 mg/dL (ref 0–200)
HDL: 71.4 mg/dL (ref >39.00)
LDL Cholesterol: 106 mg/dL — ABNORMAL HIGH (ref 0–99)
NonHDL: 112.6
TRIGLYCERIDES: 31 mg/dL (ref 0.0–149.0)
Total CHOL/HDL Ratio: 3
VLDL: 6.2 mg/dL (ref 0.0–40.0)

## 2014-05-26 LAB — BASIC METABOLIC PANEL
BUN: 19 mg/dL (ref 6–23)
CALCIUM: 9.1 mg/dL (ref 8.4–10.5)
CHLORIDE: 105 meq/L (ref 96–112)
CO2: 30 meq/L (ref 19–32)
Creatinine, Ser: 0.68 mg/dL (ref 0.40–1.20)
GFR: 96.63 mL/min (ref >60.00)
GLUCOSE: 90 mg/dL (ref 70–99)
Potassium: 4.5 mEq/L (ref 3.5–5.1)
SODIUM: 138 meq/L (ref 135–145)

## 2014-05-26 LAB — TSH: TSH: 2.5 u[IU]/mL (ref 0.35–4.50)

## 2014-06-02 ENCOUNTER — Ambulatory Visit (INDEPENDENT_AMBULATORY_CARE_PROVIDER_SITE_OTHER): Payer: 59 | Admitting: Internal Medicine

## 2014-06-02 ENCOUNTER — Encounter: Payer: Self-pay | Admitting: Internal Medicine

## 2014-06-02 VITALS — BP 96/70 | Temp 98.5°F | Ht 67.75 in | Wt 136.7 lb

## 2014-06-02 DIAGNOSIS — Z0000 Encounter for general adult medical examination without abnormal findings: Secondary | ICD-10-CM

## 2014-06-02 DIAGNOSIS — Z833 Family history of diabetes mellitus: Secondary | ICD-10-CM

## 2014-06-02 NOTE — Patient Instructions (Addendum)
Should have fasting  Blood sugar every year   Can do more if needed but .   Hg a1c can also be done but dont know if paid for by insurance  High  Glycemic  Foods can be  Reduced .   Also     eg rice  Dried fruit .     Why follow it? Research shows. . Those who follow the Mediterranean diet have a reduced risk of heart disease  . The diet is associated with a reduced incidence of Parkinson's and Alzheimer's diseases . People following the diet may have longer life expectancies and lower rates of chronic diseases  . The Dietary Guidelines for Americans recommends the Mediterranean diet as an eating plan to promote health and prevent disease  What Is the Mediterranean Diet?  . Healthy eating plan based on typical foods and recipes of Mediterranean-style cooking . The diet is primarily a plant based diet; these foods should make up a majority of meals   Starches - Plant based foods should make up a majority of meals - They are an important sources of vitamins, minerals, energy, antioxidants, and fiber - Choose whole grains, foods high in fiber and minimally processed items  - Typical grain sources include wheat, oats, barley, corn, brown rice, bulgar, farro, millet, polenta, couscous  - Various types of beans include chickpeas, lentils, fava beans, black beans, white beans   Fruits  Veggies - Large quantities of antioxidant rich fruits & veggies; 6 or more servings  - Vegetables can be eaten raw or lightly drizzled with oil and cooked  - Vegetables common to the traditional Mediterranean Diet include: artichokes, arugula, beets, broccoli, brussel sprouts, cabbage, carrots, celery, collard greens, cucumbers, eggplant, kale, leeks, lemons, lettuce, mushrooms, okra, onions, peas, peppers, potatoes, pumpkin, radishes, rutabaga, shallots, spinach, sweet potatoes, turnips, zucchini - Fruits common to the Mediterranean Diet include: apples, apricots, avocados, cherries, clementines, dates, figs,  grapefruits, grapes, melons, nectarines, oranges, peaches, pears, pomegranates, strawberries, tangerines  Fats - Replace butter and margarine with healthy oils, such as olive oil, canola oil, and tahini  - Limit nuts to no more than a handful a day  - Nuts include walnuts, almonds, pecans, pistachios, pine nuts  - Limit or avoid candied, honey roasted or heavily salted nuts - Olives are central to the [employer] - can be eaten whole or used in a variety of dishes   Meats Protein - Limiting red meat: no more than a few times a month - When eating red meat: choose lean cuts and keep the portion to the size of deck of cards - Eggs: approx. 0 to 4 times a week  - Fish and lean poultry: at least 2 a week  - Healthy protein sources include, chicken, [place], lean beef, lamb - Increase intake of seafood such as tuna, salmon, trout, mackerel, shrimp, scallops - Avoid or limit high fat processed meats such as sausage and bacon  Dairy - Include moderate amounts of low fat dairy products  - Focus on healthy dairy such as fat free yogurt, skim milk, low or reduced fat cheese - Limit dairy products higher in fat such as whole or 2% milk, cheese, ice cream  Alcohol - Moderate amounts of red wine is ok  - No more than 5 oz daily for women (all ages) and men older than age 73  - No more than 10 oz of wine daily for men younger than 58  Other - Limit sweets and other  desserts  - Use herbs and spices instead of salt to flavor foods  - Herbs and spices common to the traditional Mediterranean Diet include: basil, bay leaves, chives, cloves, cumin, fennel, garlic, lavender, marjoram, mint, oregano, parsley, pepper, rosemary, sage, savory, sumac, tarragon, thyme   It's not just a diet, it's a lifestyle:  . The Mediterranean diet includes lifestyle factors typical of those in the region  . Foods, drinks and meals are best eaten with others and savored . Daily physical activity is important for overall good  health . This could be strenuous exercise like running and aerobics . This could also be more leisurely activities such as walking, housework, yard-work, or taking the stairs . Moderation is the key; a balanced and healthy diet accommodates most foods and drinks . Consider portion sizes and frequency of consumption of certain foods   Meal Ideas & Options:  . Breakfast:  o Whole wheat toast or whole wheat English muffins with peanut butter & hard boiled egg o Steel cut oats topped with apples & cinnamon and skim milk  o Fresh fruit: banana, strawberries, melon, berries, peaches  o Smoothies: strawberries, bananas, greek yogurt, peanut butter o Low fat greek yogurt with blueberries and granola  o Egg white omelet with spinach and mushrooms o Breakfast couscous: whole wheat couscous, apricots, skim milk, cranberries  . Sandwiches:  o Hummus and grilled vegetables (peppers, zucchini, squash) on whole wheat bread   o Grilled chicken on whole wheat pita with lettuce, tomatoes, cucumbers or tzatziki  o Tuna salad on whole wheat bread: tuna salad made with greek yogurt, olives, red peppers, capers, green onions o Garlic rosemary lamb pita: lamb sauted with garlic, rosemary, salt & pepper; add lettuce, cucumber, greek yogurt to pita - flavor with lemon juice and black pepper  . Seafood:  o Mediterranean grilled salmon, seasoned with garlic, basil, parsley, lemon juice and black pepper o Shrimp, lemon, and spinach whole-grain pasta salad made with low fat greek yogurt  o Seared scallops with lemon orzo  o Seared tuna steaks seasoned salt, pepper, coriander topped with tomato mixture of olives, tomatoes, olive oil, minced garlic, parsley, green onions and cappers  . Meats:  o Herbed greek chicken salad with kalamata olives, cucumber, feta  o Red bell peppers stuffed with spinach, bulgur, lean ground beef (or lentils) & topped with feta   o Kebabs: skewers of chicken, tomatoes, onions, zucchini,  squash  o [place] burgers: made with red onions, mint, dill, lemon juice, feta cheese topped with roasted red peppers . Vegetarian o Cucumber salad: cucumbers, artichoke hearts, celery, red onion, feta cheese, tossed in olive oil & lemon juice  o Hummus and whole grain pita points with a greek salad (lettuce, tomato, feta, olives, cucumbers, red onion) o Lentil soup with celery, carrots made with vegetable broth, garlic, salt and pepper  o Tabouli salad: parsley, bulgur, mint, scallions, cucumbers, tomato, radishes, lemon juice, olive oil, salt and pepper.
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
## 2014-06-03 ENCOUNTER — Encounter: Payer: Self-pay | Admitting: Internal Medicine

## 2014-08-20 ENCOUNTER — Encounter: Payer: Self-pay | Admitting: Gastroenterology

## 2014-10-27 ENCOUNTER — Encounter: Payer: Self-pay | Admitting: *Deleted

## 2015-08-11 ENCOUNTER — Telehealth: Payer: Self-pay | Admitting: Internal Medicine

## 2015-08-11 NOTE — Telephone Encounter (Signed)
atient Name: Tracey [name]  DOB: 10-30-1960    Initial Comment caller states she was bitten by a dog yesterday   Nurse Assessment      Guidelines    Guideline Title Affirmed Question Affirmed Notes       Final Disposition User   FINAL ATTEMPT MADE - message left Stringer, RN, Dwana CurdVera    Comments  Attempted to return call and recording states that the person I have called has a voice mail box that has not been set up yet. Unable to leave message. Will try call again later.  Attempted to return call and recording states that the person I have called has a voice mail box that has not been set up yet. Unable to leave message. Will try call again later.  Attempted to return call and recording states that the person I have called has a voice mail box that has not been set up yet. Unable to leave message. Will close call as 3 attempts have been made

## 2015-08-11 NOTE — Telephone Encounter (Signed)
Tried to reach the pt.  Used telephone number in message.  Received a message that the voicemail box has not been set up.  Also tried home and cell number listed in the chart.  Left a message on both.

## 2015-08-12 NOTE — Telephone Encounter (Signed)
Left vm yesterday afternoon requesting pt to return our call

## 2015-09-05 ENCOUNTER — Other Ambulatory Visit: Payer: 59

## 2015-09-05 ENCOUNTER — Telehealth: Payer: Self-pay | Admitting: Internal Medicine

## 2015-09-05 NOTE — Telephone Encounter (Signed)
Patient needs to reschedule her physical.  I cancelled what was in the system because patient will be on vacation.  I need to know if Dr Fabian SharpPanosh can approve a slot for a physical before September 15th.  Patient needs a new appointment before then for work.

## 2015-09-07 NOTE — Telephone Encounter (Signed)
Feel free to use 2 slots on a Tuesday or Wednesday.  Try not to put next to another cpx or wellness if possible.  Thanks!

## 2015-09-09 ENCOUNTER — Other Ambulatory Visit: Payer: 59

## 2015-09-12 ENCOUNTER — Other Ambulatory Visit: Payer: 59

## 2015-09-13 ENCOUNTER — Other Ambulatory Visit (INDEPENDENT_AMBULATORY_CARE_PROVIDER_SITE_OTHER): Payer: 59

## 2015-09-13 DIAGNOSIS — Z0000 Encounter for general adult medical examination without abnormal findings: Secondary | ICD-10-CM | POA: Diagnosis not present

## 2015-09-13 LAB — CBC WITH DIFFERENTIAL/PLATELET
BASOS PCT: 0.5 % (ref 0.0–3.0)
Basophils Absolute: 0 10*3/uL (ref 0.0–0.1)
EOS PCT: 4.5 % (ref 0.0–5.0)
Eosinophils Absolute: 0.2 10*3/uL (ref 0.0–0.7)
HCT: 37.7 % (ref 36.0–46.0)
HEMOGLOBIN: 12.8 g/dL (ref 12.0–15.0)
LYMPHS ABS: 1.5 10*3/uL (ref 0.7–4.0)
Lymphocytes Relative: 31.2 % (ref 12.0–46.0)
MCHC: 33.9 g/dL (ref 30.0–36.0)
MCV: 88.9 fl (ref 78.0–100.0)
MONO ABS: 0.4 10*3/uL (ref 0.1–1.0)
MONOS PCT: 7.1 % (ref 3.0–12.0)
NEUTROS PCT: 56.7 % (ref 43.0–77.0)
Neutro Abs: 2.8 10*3/uL (ref 1.4–7.7)
Platelets: 203 10*3/uL (ref 150.0–400.0)
RBC: 4.24 Mil/uL (ref 3.87–5.11)
RDW: 13.8 % (ref 11.5–15.5)
WBC: 4.9 10*3/uL (ref 4.0–10.5)

## 2015-09-13 LAB — BASIC METABOLIC PANEL
BUN: 14 mg/dL (ref 6–23)
CHLORIDE: 107 meq/L (ref 96–112)
CO2: 23 mEq/L (ref 19–32)
Calcium: 8.8 mg/dL (ref 8.4–10.5)
Creatinine, Ser: 0.68 mg/dL (ref 0.40–1.20)
GFR: 96.15 mL/min (ref >60.00)
GLUCOSE: 91 mg/dL (ref 70–99)
POTASSIUM: 3.7 meq/L (ref 3.5–5.1)
SODIUM: 138 meq/L (ref 135–145)

## 2015-09-13 LAB — LIPID PANEL
Cholesterol: 196 mg/dL (ref 0–200)
HDL: 73.7 mg/dL (ref >39.00)
LDL CALC: 114 mg/dL — AB (ref 0–99)
NONHDL: 122.35
Total CHOL/HDL Ratio: 3
Triglycerides: 40 mg/dL (ref 0.0–149.0)
VLDL: 8 mg/dL (ref 0.0–40.0)

## 2015-09-13 LAB — HEPATIC FUNCTION PANEL
ALK PHOS: 45 U/L (ref 39–117)
ALT: 15 U/L (ref 0–35)
AST: 19 U/L (ref 0–37)
Albumin: 3.8 g/dL (ref 3.5–5.2)
BILIRUBIN TOTAL: 0.8 mg/dL (ref 0.2–1.2)
Bilirubin, Direct: 0.1 mg/dL (ref 0.0–0.3)
Total Protein: 6.9 g/dL (ref 6.0–8.3)

## 2015-09-13 LAB — TSH: TSH: 3.88 u[IU]/mL (ref 0.35–4.50)

## 2015-09-15 NOTE — Patient Instructions (Addendum)
Continue lifestyle intervention healthy eating and exercise .  Every other year or yearly visit ( if seeing GYNE)  Consider otc dramanine or bonine for  Motion sickness  But ok to try patch also .  Health Maintenance, Female Adopting a healthy lifestyle and getting preventive care can go a long way to promote health and wellness. Talk with your health care provider about what schedule of regular examinations is right for you. This is a good chance for you to check in with your provider about disease prevention and staying healthy. In between checkups, there are plenty of things you can do on your own. Experts have done a lot of research about which lifestyle changes and preventive measures are most likely to keep you healthy. Ask your health care provider for more information. WEIGHT AND DIET  Eat a healthy diet  Be sure to include plenty of vegetables, fruits, low-fat dairy products, and lean protein.  Do not eat a lot of foods high in solid fats, added sugars, or salt.  Get regular exercise. This is one of the most important things you can do for your health.  Most adults should exercise for at least 150 minutes each week. The exercise should increase your heart rate and make you sweat (moderate-intensity exercise).  Most adults should also do strengthening exercises at least twice a week. This is in addition to the moderate-intensity exercise.  Maintain a healthy weight  Body mass index (BMI) is a measurement that can be used to identify possible weight problems. It estimates body fat based on height and weight. Your health care provider can help determine your BMI and help you achieve or maintain a healthy weight.  For females 76 years of age and older:   A BMI below 18.5 is considered underweight.  A BMI of 18.5 to 24.9 is normal.  A BMI of 25 to 29.9 is considered overweight.  A BMI of 30 and above is considered obese.  Watch levels of cholesterol and blood lipids  You  should start having your blood tested for lipids and cholesterol at 53 years of age, then have this test every 5 years.  You may need to have your cholesterol levels checked more often if:  Your lipid or cholesterol levels are high.  You are older than 53 years of age.  You are at high risk for heart disease.  CANCER SCREENING   Lung Cancer  Lung cancer screening is recommended for adults 22-20 years old who are at high risk for lung cancer because of a history of smoking.  A yearly low-dose CT scan of the lungs is recommended for people who:  Currently smoke.  Have quit within the past 15 years.  Have at least a 30-pack-year history of smoking. A pack year is smoking an average of one pack of cigarettes a day for 1 year.  Yearly screening should continue until it has been 15 years since you quit.  Yearly screening should stop if you develop a health problem that would prevent you from having lung cancer treatment.  Breast Cancer  Practice breast self-awareness. This means understanding how your breasts normally appear and feel.  It also means doing regular breast self-exams. Let your health care provider know about any changes, no matter how small.  If you are in your 20s or 30s, you should have a clinical breast exam (CBE) by a health care provider every 1-3 years as part of a regular health exam.  If you are 40  or older, have a CBE every year. Also consider having a breast X-ray (mammogram) every year.  If you have a family history of breast cancer, talk to your health care provider about genetic screening.  If you are at high risk for breast cancer, talk to your health care provider about having an MRI and a mammogram every year.  Breast cancer gene (BRCA) assessment is recommended for women who have family members with BRCA-related cancers. BRCA-related cancers include:  Breast.  Ovarian.  Tubal.  Peritoneal cancers.  Results of the assessment will determine  the need for genetic counseling and BRCA1 and BRCA2 testing. Cervical Cancer Your health care provider may recommend that you be screened regularly for cancer of the pelvic organs (ovaries, uterus, and vagina). This screening involves a pelvic examination, including checking for microscopic changes to the surface of your cervix (Pap test). You may be encouraged to have this screening done every 3 years, beginning at age 55.  For women ages 88-65, health care providers may recommend pelvic exams and Pap testing every 3 years, or they may recommend the Pap and pelvic exam, combined with testing for human papilloma virus (HPV), every 5 years. Some types of HPV increase your risk of cervical cancer. Testing for HPV may also be done on women of any age with unclear Pap test results.  Other health care providers may not recommend any screening for nonpregnant women who are considered low risk for pelvic cancer and who do not have symptoms. Ask your health care provider if a screening pelvic exam is right for you.  If you have had past treatment for cervical cancer or a condition that could lead to cancer, you need Pap tests and screening for cancer for at least 20 years after your treatment. If Pap tests have been discontinued, your risk factors (such as having a new sexual partner) need to be reassessed to determine if screening should resume. Some women have medical problems that increase the chance of getting cervical cancer. In these cases, your health care provider may recommend more frequent screening and Pap tests. Colorectal Cancer  This type of cancer can be detected and often prevented.  Routine colorectal cancer screening usually begins at 53 years of age and continues through 53 years of age.  Your health care provider may recommend screening at an earlier age if you have risk factors for colon cancer.  Your health care provider may also recommend using home test kits to check for hidden blood  in the stool.  A small camera at the end of a tube can be used to examine your colon directly (sigmoidoscopy or colonoscopy). This is done to check for the earliest forms of colorectal cancer.  Routine screening usually begins at age 54.  Direct examination of the colon should be repeated every 5-10 years through 53 years of age. However, you may need to be screened more often if early forms of precancerous polyps or small growths are found. Skin Cancer  Check your skin from head to toe regularly.  Tell your health care provider about any new moles or changes in moles, especially if there is a change in a mole's shape or color.  Also tell your health care provider if you have a mole that is larger than the size of a pencil eraser.  Always use sunscreen. Apply sunscreen liberally and repeatedly throughout the day.  Protect yourself by wearing long sleeves, pants, a wide-brimmed hat, and sunglasses whenever you are outside. HEART DISEASE,  DIABETES, AND HIGH BLOOD PRESSURE   High blood pressure causes heart disease and increases the risk of stroke. High blood pressure is more likely to develop in:  People who have blood pressure in the high end of the normal range (130-139/85-89 mm Hg).  People who are overweight or obese.  People who are African American.  If you are 18-39 years of age, have your blood pressure checked every 3-5 years. If you are 40 years of age or older, have your blood pressure checked every year. You should have your blood pressure measured twice--once when you are at a hospital or clinic, and once when you are not at a hospital or clinic. Record the average of the two measurements. To check your blood pressure when you are not at a hospital or clinic, you can use:  An automated blood pressure machine at a pharmacy.  A home blood pressure monitor.  If you are between 55 years and 79 years old, ask your health care provider if you should take aspirin to prevent  strokes.  Have regular diabetes screenings. This involves taking a blood sample to check your fasting blood sugar level.  If you are at a normal weight and have a low risk for diabetes, have this test once every three years after 53 years of age.  If you are overweight and have a high risk for diabetes, consider being tested at a younger age or more often. PREVENTING INFECTION  Hepatitis B  If you have a higher risk for hepatitis B, you should be screened for this virus. You are considered at high risk for hepatitis B if:  You were born in a country where hepatitis B is common. Ask your health care provider which countries are considered high risk.  Your parents were born in a high-risk country, and you have not been immunized against hepatitis B (hepatitis B vaccine).  You have HIV or AIDS.  You use needles to inject street drugs.  You live with someone who has hepatitis B.  You have had sex with someone who has hepatitis B.  You get hemodialysis treatment.  You take certain medicines for conditions, including cancer, organ transplantation, and autoimmune conditions. Hepatitis C  Blood testing is recommended for:  Everyone born from 1945 through 1965.  Anyone with known risk factors for hepatitis C. Sexually transmitted infections (STIs)  You should be screened for sexually transmitted infections (STIs) including gonorrhea and chlamydia if:  You are sexually active and are younger than 53 years of age.  You are older than 53 years of age and your health care provider tells you that you are at risk for this type of infection.  Your sexual activity has changed since you were last screened and you are at an increased risk for chlamydia or gonorrhea. Ask your health care provider if you are at risk.  If you do not have HIV, but are at risk, it may be recommended that you take a prescription medicine daily to prevent HIV infection. This is called pre-exposure prophylaxis  (PrEP). You are considered at risk if:  You are sexually active and do not regularly use condoms or know the HIV status of your partner(s).  You take drugs by injection.  You are sexually active with a partner who has HIV. Talk with your health care provider about whether you are at high risk of being infected with HIV. If you choose to begin PrEP, you should first be tested for HIV. You should then   be tested every 3 months for as long as you are taking PrEP.  PREGNANCY   If you are premenopausal and you may become pregnant, ask your health care provider about preconception counseling.  If you may become pregnant, take 400 to 800 micrograms (mcg) of folic acid every day.  If you want to prevent pregnancy, talk to your health care provider about birth control (contraception). OSTEOPOROSIS AND MENOPAUSE   Osteoporosis is a disease in which the bones lose minerals and strength with aging. This can result in serious bone fractures. Your risk for osteoporosis can be identified using a bone density scan.  If you are 80 years of age or older, or if you are at risk for osteoporosis and fractures, ask your health care provider if you should be screened.  Ask your health care provider whether you should take a calcium or vitamin D supplement to lower your risk for osteoporosis.  Menopause may have certain physical symptoms and risks.  Hormone replacement therapy may reduce some of these symptoms and risks. Talk to your health care provider about whether hormone replacement therapy is right for you.  HOME CARE INSTRUCTIONS   Schedule regular health, dental, and eye exams.  Stay current with your immunizations.   Do not use any tobacco products including cigarettes, chewing tobacco, or electronic cigarettes.  If you are pregnant, do not drink alcohol.  If you are breastfeeding, limit how much and how often you drink alcohol.  Limit alcohol intake to no more than 1 drink per day for  nonpregnant women. One drink equals 12 ounces of beer, 5 ounces of wine, or 1 ounces of hard liquor.  Do not use street drugs.  Do not share needles.  Ask your health care provider for help if you need support or information about quitting drugs.  Tell your health care provider if you often feel depressed.  Tell your health care provider if you have ever been abused or do not feel safe at home.   This information is not intended to replace advice given to you by your health care provider. Make sure you discuss any questions you have with your health care provider.   Document Released: 10/02/2010 Document Revised: 04/09/2014 Document Reviewed: 02/18/2013 Elsevier Interactive Patient Education 2016 [place] protect organs, store calcium, and anchor muscles. Good health habits, such as eating nutritious foods and exercising regularly, are important for maintaining healthy bones. They can also help to prevent a condition that causes bones to lose density and become weak and brittle (osteoporosis). WHY IS BONE MASS IMPORTANT? Bone mass refers to the amount of bone tissue that you have. The higher your bone mass, the stronger your bones. An important step toward having healthy bones throughout life is to have strong and dense bones during childhood. A young adult who has a high bone mass is more likely to have a high bone mass later in life. Bone mass at its greatest it is called peak bone mass. A large decline in bone mass occurs in older adults. In women, it occurs about the time of menopause. During this time, it is important to practice good health habits, because if more bone is lost than what is replaced, the bones will become less healthy and more likely to break (fracture). If you find that you have a low bone mass, you may be able to prevent osteoporosis or further bone loss by changing your diet and lifestyle. HOW CAN I FIND OUT  IF MY BONE MASS IS LOW? Bone mass  can be measured with an X-ray test that is called a bone mineral density (BMD) test. This test is recommended for all women who are age 98 or older. It may also be recommended for men who are age 72 or older, or for people who are more likely to develop osteoporosis due to:  Having bones that break easily.  Having a long-term disease that weakens bones, such as kidney disease or rheumatoid arthritis.  Having menopause earlier than normal.  Taking medicine that weakens bones, such as steroids, thyroid hormones, or hormone treatment for breast cancer or prostate cancer.  Smoking.  Drinking three or more alcoholic drinks each day. WHAT ARE THE NUTRITIONAL RECOMMENDATIONS FOR HEALTHY BONES? To have healthy bones, you need to get enough of the right minerals and vitamins. Most nutrition experts recommend getting these nutrients from the foods that you eat. Nutritional recommendations vary from person to person. Ask your health care provider what is healthy for you. Here are some general guidelines. Calcium Recommendations Calcium is the most important (essential) mineral for bone health. Most people can get enough calcium from their diet, but supplements may be recommended for people who are at risk for osteoporosis. Good sources of calcium include:  Dairy products, such as low-fat or nonfat milk, cheese, and yogurt.  Dark green leafy vegetables, such as bok choy and broccoli.  Calcium-fortified foods, such as orange juice, cereal, bread, soy beverages, and tofu products.  Nuts, such as almonds. Follow these recommended amounts for daily calcium intake:  Children, age 69-3: 700 mg.  Children, age 70-8: 1,000 mg.  Children, age 36-13: 1,300 mg.  Teens, age 97-18: 1,300 mg.  Adults, age 692-50: 1,000 mg.  Adults, age 61-70:  Men: 1,000 mg.  Women: 1,200 mg.  Adults, age 58 or older: 1,200 mg.  Pregnant and breastfeeding females:  Teens: 1,300 mg.  Adults: 1,000 mg. Vitamin D  Recommendations Vitamin D is the most essential vitamin for bone health. It helps the body to absorb calcium. Sunlight stimulates the skin to make vitamin D, so be sure to get enough sunlight. If you live in a cold climate or you do not get outside often, your health care provider may recommend that you take vitamin D supplements. Good sources of vitamin D in your diet include:  Egg yolks.  Saltwater fish.  Milk and cereal fortified with vitamin D. Follow these recommended amounts for daily vitamin D intake:  Children and teens, age 69-18: 600 international units.  Adults, age 21 or younger: 400-800 international units.  Adults, age 697 or older: 800-1,000 international units. Other Nutrients Other nutrients for bone health include:  Phosphorus. This mineral is found in meat, poultry, dairy foods, nuts, and legumes. The recommended daily intake for adult men and adult women is 700 mg.  Magnesium. This mineral is found in seeds, nuts, dark green vegetables, and legumes. The recommended daily intake for adult men is 400-420 mg. For adult women, it is 310-320 mg.  Vitamin K. This vitamin is found in green leafy vegetables. The recommended daily intake is 120 mg for adult men and 90 mg for adult women. WHAT TYPE OF PHYSICAL ACTIVITY IS BEST FOR BUILDING AND MAINTAINING HEALTHY BONES? Weight-bearing and strength-building activities are important for building and maintaining peak bone mass. Weight-bearing activities cause muscles and bones to work against gravity. Strength-building activities increases muscle strength that supports bones. Weight-bearing and muscle-building activities include:  Walking and hiking.  Jogging  and running.  Dancing.  Gym exercises.  Lifting weights.  Tennis and racquetball.  Climbing stairs.  Aerobics. Adults should get at least 30 minutes of moderate physical activity on most days. Children should get at least 60 minutes of moderate physical activity on  most days. Ask your health care provide what type of exercise is best for you. WHERE CAN I FIND MORE INFORMATION? For more information, check out the following websites:  [place]: [employer]se  [employer] [facility] [employer]: http://www.niams.AnonymousEar.fr.asp   This information is not intended to replace advice given to you by your health care provider. Make sure you discuss any questions you have with your health care provider.   Document Released: 06/09/2003 Document Revised: 08/03/2014 Document Reviewed: 03/24/2014 Elsevier Interactive Patient Education [employer].

## 2015-09-15 NOTE — Progress Notes (Signed)
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 given to you by your health care provider. Make sure you discuss any questions you have with your health care provider.   Document Released: 06/09/2003 Document Revised: 08/03/2014 Document Reviewed: 03/24/2014 Elsevier Interactive Patient Education 2016 [place] K. Panosh M.D.

## 2015-09-16 ENCOUNTER — Encounter: Payer: Self-pay | Admitting: Internal Medicine

## 2015-09-16 ENCOUNTER — Ambulatory Visit (INDEPENDENT_AMBULATORY_CARE_PROVIDER_SITE_OTHER): Payer: 59 | Admitting: Internal Medicine

## 2015-09-16 VITALS — BP 96/62 | Temp 98.3°F | Ht 67.5 in | Wt 137.1 lb

## 2015-09-16 DIAGNOSIS — Z0000 Encounter for general adult medical examination without abnormal findings: Secondary | ICD-10-CM | POA: Diagnosis not present

## 2015-09-16 DIAGNOSIS — Z8262 Family history of osteoporosis: Secondary | ICD-10-CM

## 2015-09-16 MED ORDER — SCOPOLAMINE 1 MG/3DAYS TD PT72: 1.0000 | MEDICATED_PATCH | TRANSDERMAL | Status: DC

## 2015-09-26 ENCOUNTER — Other Ambulatory Visit: Payer: 59

## 2015-10-03 ENCOUNTER — Encounter: Payer: 59 | Admitting: Internal Medicine
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
## 2016-07-25 ENCOUNTER — Ambulatory Visit: Payer: 59 | Admitting: Internal Medicine

## 2016-07-30 ENCOUNTER — Encounter: Payer: Self-pay | Admitting: Neurology

## 2016-07-30 ENCOUNTER — Ambulatory Visit (INDEPENDENT_AMBULATORY_CARE_PROVIDER_SITE_OTHER): Payer: 59 | Admitting: Neurology

## 2016-07-30 DIAGNOSIS — G510 Bell's palsy: Secondary | ICD-10-CM

## 2016-07-30 MED ORDER — ARTIFICIAL TEARS OPHTHALMIC OINT: TOPICAL_OINTMENT | Freq: Every evening | OPHTHALMIC | 6 refills | Status: DC | PRN

## 2016-07-30 MED ORDER — ACYCLOVIR 800 MG PO TABS: 800.0000 mg | ORAL_TABLET | Freq: Every day | ORAL | 0 refills | Status: DC

## 2016-07-30 NOTE — Patient Instructions (Signed)
B complex
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
## 2016-09-21 ENCOUNTER — Ambulatory Visit (INDEPENDENT_AMBULATORY_CARE_PROVIDER_SITE_OTHER): Payer: 59 | Admitting: Internal Medicine

## 2016-09-21 ENCOUNTER — Encounter: Payer: Self-pay | Admitting: Internal Medicine

## 2016-09-21 VITALS — BP 100/70 | HR 53 | Temp 97.7°F | Ht 68.0 in | Wt 135.4 lb

## 2016-09-21 DIAGNOSIS — H04201 Unspecified epiphora, right lacrimal gland: Secondary | ICD-10-CM

## 2016-09-21 DIAGNOSIS — Z0001 Encounter for general adult medical examination with abnormal findings: Secondary | ICD-10-CM

## 2016-09-21 DIAGNOSIS — G510 Bell's palsy: Secondary | ICD-10-CM

## 2016-09-21 DIAGNOSIS — Z8262 Family history of osteoporosis: Secondary | ICD-10-CM | POA: Diagnosis not present

## 2016-09-21 DIAGNOSIS — Z0000 Encounter for general adult medical examination without abnormal findings: Secondary | ICD-10-CM

## 2016-09-21 DIAGNOSIS — Z23 Encounter for immunization: Secondary | ICD-10-CM | POA: Diagnosis not present

## 2016-09-21 LAB — CBC WITH DIFFERENTIAL/PLATELET
BASOS PCT: 1.1 % (ref 0.0–3.0)
Basophils Absolute: 0 10*3/uL (ref 0.0–0.1)
EOS PCT: 4.6 % (ref 0.0–5.0)
Eosinophils Absolute: 0.2 10*3/uL (ref 0.0–0.7)
HCT: 42 % (ref 36.0–46.0)
Hemoglobin: 13.8 g/dL (ref 12.0–15.0)
LYMPHS ABS: 1.5 10*3/uL (ref 0.7–4.0)
Lymphocytes Relative: 37.1 % (ref 12.0–46.0)
MCHC: 32.9 g/dL (ref 30.0–36.0)
MCV: 92.5 fl (ref 78.0–100.0)
MONO ABS: 0.3 10*3/uL (ref 0.1–1.0)
MONOS PCT: 8.1 % (ref 3.0–12.0)
NEUTROS PCT: 49.1 % (ref 43.0–77.0)
Neutro Abs: 2 10*3/uL (ref 1.4–7.7)
Platelets: 218 10*3/uL (ref 150.0–400.0)
RBC: 4.54 Mil/uL (ref 3.87–5.11)
RDW: 14.5 % (ref 11.5–15.5)
WBC: 4 10*3/uL (ref 4.0–10.5)

## 2016-09-21 LAB — BASIC METABOLIC PANEL
BUN: 15 mg/dL (ref 6–23)
CHLORIDE: 106 meq/L (ref 96–112)
CO2: 29 meq/L (ref 19–32)
Calcium: 9.4 mg/dL (ref 8.4–10.5)
Creatinine, Ser: 0.66 mg/dL (ref 0.40–1.20)
GFR: 99.13 mL/min (ref >60.00)
GLUCOSE: 96 mg/dL (ref 70–99)
POTASSIUM: 4.3 meq/L (ref 3.5–5.1)
SODIUM: 142 meq/L (ref 135–145)

## 2016-09-21 LAB — TSH: TSH: 1.38 u[IU]/mL (ref 0.35–4.50)

## 2016-09-21 LAB — HEPATIC FUNCTION PANEL
ALBUMIN: 4.2 g/dL (ref 3.5–5.2)
ALT: 14 U/L (ref 0–35)
AST: 18 U/L (ref 0–37)
Alkaline Phosphatase: 57 U/L (ref 39–117)
BILIRUBIN TOTAL: 0.6 mg/dL (ref 0.2–1.2)
Bilirubin, Direct: 0.1 mg/dL (ref 0.0–0.3)
Total Protein: 6.6 g/dL (ref 6.0–8.3)

## 2016-09-21 LAB — LIPID PANEL
Cholesterol: 224 mg/dL — ABNORMAL HIGH (ref 0–200)
HDL: 84.8 mg/dL (ref >39.00)
LDL CALC: 132 mg/dL — AB (ref 0–99)
NonHDL: 139.55
TRIGLYCERIDES: 40 mg/dL (ref 0.0–149.0)
Total CHOL/HDL Ratio: 3
VLDL: 8 mg/dL (ref 0.0–40.0)

## 2016-09-21 NOTE — Patient Instructions (Addendum)
Will  Do opthalmology referral.  fo ryou   Td today   Get injection appt  When getting shingrix vaccine     Health Maintenance, Female Adopting a healthy lifestyle and getting preventive care can go a long way to promote health and wellness. Talk with your health care provider about what schedule of regular examinations is right for you. This is a good chance for you to check in with your provider about disease prevention and staying healthy. In between checkups, there are plenty of things you can do on your own. Experts have done a lot of research about which lifestyle changes and preventive measures are most likely to keep you healthy. Ask your health care provider for more information. Weight and diet Eat a healthy diet  Be sure to include plenty of vegetables, fruits, low-fat dairy products, and lean protein.  Do not eat a lot of foods high in solid fats, added sugars, or salt.  Get regular exercise. This is one of the most important things you can do for your health. ? Most adults should exercise for at least 150 minutes each week. The exercise should increase your heart rate and make you sweat (moderate-intensity exercise). ? Most adults should also do strengthening exercises at least twice a week. This is in addition to the moderate-intensity exercise.  Maintain a healthy weight  Body mass index (BMI) is a measurement that can be used to identify possible weight problems. It estimates body fat based on height and weight. Your health care provider can help determine your BMI and help you achieve or maintain a healthy weight.  For females 58 years of age and older: ? A BMI below 18.5 is considered underweight. ? A BMI of 18.5 to 24.9 is normal. ? A BMI of 25 to 29.9 is considered overweight. ? A BMI of 30 and above is considered obese.  Watch levels of cholesterol and blood lipids  You should start having your blood tested for lipids and cholesterol at 54 years of age, then have  this test every 5 years.  You may need to have your cholesterol levels checked more often if: ? Your lipid or cholesterol levels are high. ? You are older than 54 years of age. ? You are at high risk for heart disease.  Cancer screening Lung Cancer  Lung cancer screening is recommended for adults 4-80 years old who are at high risk for lung cancer because of a history of smoking.  A yearly low-dose CT scan of the lungs is recommended for people who: ? Currently smoke. ? Have quit within the past 15 years. ? Have at least a 30-pack-year history of smoking. A pack year is smoking an average of one pack of cigarettes a day for 1 year.  Yearly screening should continue until it has been 15 years since you quit.  Yearly screening should stop if you develop a health problem that would prevent you from having lung cancer treatment.  Breast Cancer  Practice breast self-awareness. This means understanding how your breasts normally appear and feel.  It also means doing regular breast self-exams. Let your health care provider know about any changes, no matter how small.  If you are in your 20s or 30s, you should have a clinical breast exam (CBE) by a health care provider every 1-3 years as part of a regular health exam.  If you are 56 or older, have a CBE every year. Also consider having a breast X-ray (mammogram) every year.  If you have a family history of breast cancer, talk to your health care provider about genetic screening.  If you are at high risk for breast cancer, talk to your health care provider about having an MRI and a mammogram every year.  Breast cancer gene (BRCA) assessment is recommended for women who have family members with BRCA-related cancers. BRCA-related cancers include: ? Breast. ? Ovarian. ? Tubal. ? Peritoneal cancers.  Results of the assessment will determine the need for genetic counseling and BRCA1 and BRCA2 testing.  Cervical Cancer Your health care  provider may recommend that you be screened regularly for cancer of the pelvic organs (ovaries, uterus, and vagina). This screening involves a pelvic examination, including checking for microscopic changes to the surface of your cervix (Pap test). You may be encouraged to have this screening done every 3 years, beginning at age 8.  For women ages 4-65, health care providers may recommend pelvic exams and Pap testing every 3 years, or they may recommend the Pap and pelvic exam, combined with testing for human papilloma virus (HPV), every 5 years. Some types of HPV increase your risk of cervical cancer. Testing for HPV may also be done on women of any age with unclear Pap test results.  Other health care providers may not recommend any screening for nonpregnant women who are considered low risk for pelvic cancer and who do not have symptoms. Ask your health care provider if a screening pelvic exam is right for you.  If you have had past treatment for cervical cancer or a condition that could lead to cancer, you need Pap tests and screening for cancer for at least 20 years after your treatment. If Pap tests have been discontinued, your risk factors (such as having a new sexual partner) need to be reassessed to determine if screening should resume. Some women have medical problems that increase the chance of getting cervical cancer. In these cases, your health care provider may recommend more frequent screening and Pap tests.  Colorectal Cancer  This type of cancer can be detected and often prevented.  Routine colorectal cancer screening usually begins at 54 years of age and continues through 54 years of age.  Your health care provider may recommend screening at an earlier age if you have risk factors for colon cancer.  Your health care provider may also recommend using home test kits to check for hidden blood in the stool.  A small camera at the end of a tube can be used to examine your colon  directly (sigmoidoscopy or colonoscopy). This is done to check for the earliest forms of colorectal cancer.  Routine screening usually begins at age 60.  Direct examination of the colon should be repeated every 5-10 years through 54 years of age. However, you may need to be screened more often if early forms of precancerous polyps or small growths are found.  Skin Cancer  Check your skin from head to toe regularly.  Tell your health care provider about any new moles or changes in moles, especially if there is a change in a mole's shape or color.  Also tell your health care provider if you have a mole that is larger than the size of a pencil eraser.  Always use sunscreen. Apply sunscreen liberally and repeatedly throughout the day.  Protect yourself by wearing long sleeves, pants, a wide-brimmed hat, and sunglasses whenever you are outside.  Heart disease, diabetes, and high blood pressure  High blood pressure causes heart disease and  increases the risk of stroke. High blood pressure is more likely to develop in: ? People who have blood pressure in the high end of the normal range (130-139/85-89 mm Hg). ? People who are overweight or obese. ? People who are African American.  If you are 34-35 years of age, have your blood pressure checked every 3-5 years. If you are 32 years of age or older, have your blood pressure checked every year. You should have your blood pressure measured twice-once when you are at a hospital or clinic, and once when you are not at a hospital or clinic. Record the average of the two measurements. To check your blood pressure when you are not at a hospital or clinic, you can use: ? An automated blood pressure machine at a pharmacy. ? A home blood pressure monitor.  If you are between 25 years and 42 years old, ask your health care provider if you should take aspirin to prevent strokes.  Have regular diabetes screenings. This involves taking a blood sample to  check your fasting blood sugar level. ? If you are at a normal weight and have a low risk for diabetes, have this test once every three years after 54 years of age. ? If you are overweight and have a high risk for diabetes, consider being tested at a younger age or more often. Preventing infection Hepatitis B  If you have a higher risk for hepatitis B, you should be screened for this virus. You are considered at high risk for hepatitis B if: ? You were born in a country where hepatitis B is common. Ask your health care provider which countries are considered high risk. ? Your parents were born in a high-risk country, and you have not been immunized against hepatitis B (hepatitis B vaccine). ? You have HIV or AIDS. ? You use needles to inject street drugs. ? You live with someone who has hepatitis B. ? You have had sex with someone who has hepatitis B. ? You get hemodialysis treatment. ? You take certain medicines for conditions, including cancer, organ transplantation, and autoimmune conditions.  Hepatitis C  Blood testing is recommended for: ? Everyone born from [birth year] through 1963. ? Anyone with known risk factors for hepatitis C.  Sexually transmitted infections (STIs)  You should be screened for sexually transmitted infections (STIs) including gonorrhea and chlamydia if: ? You are sexually active and are younger than 54 years of age. ? You are older than 54 years of age and your health care provider tells you that you are at risk for this type of infection. ? Your sexual activity has changed since you were last screened and you are at an increased risk for chlamydia or gonorrhea. Ask your health care provider if you are at risk.  If you do not have HIV, but are at risk, it may be recommended that you take a prescription medicine daily to prevent HIV infection. This is called pre-exposure prophylaxis (PrEP). You are considered at risk if: ? You are sexually active and do not regularly  use condoms or know the HIV status of your partner(s). ? You take drugs by injection. ? You are sexually active with a partner who has HIV.  Talk with your health care provider about whether you are at high risk of being infected with HIV. If you choose to begin PrEP, you should first be tested for HIV. You should then be tested every 3 months for as long as you are taking  PrEP. Pregnancy  If you are premenopausal and you may become pregnant, ask your health care provider about preconception counseling.  If you may become pregnant, take 400 to 800 micrograms (mcg) of folic acid every day.  If you want to prevent pregnancy, talk to your health care provider about birth control (contraception). Osteoporosis and menopause  Osteoporosis is a disease in which the bones lose minerals and strength with aging. This can result in serious bone fractures. Your risk for osteoporosis can be identified using a bone density scan.  If you are 24 years of age or older, or if you are at risk for osteoporosis and fractures, ask your health care provider if you should be screened.  Ask your health care provider whether you should take a calcium or vitamin D supplement to lower your risk for osteoporosis.  Menopause may have certain physical symptoms and risks.  Hormone replacement therapy may reduce some of these symptoms and risks. Talk to your health care provider about whether hormone replacement therapy is right for you. Follow these instructions at home:  Schedule regular health, dental, and eye exams.  Stay current with your immunizations.  Do not use any tobacco products including cigarettes, chewing tobacco, or electronic cigarettes.  If you are pregnant, do not drink alcohol.  If you are breastfeeding, limit how much and how often you drink alcohol.  Limit alcohol intake to no more than 1 drink per day for nonpregnant women. One drink equals 12 ounces of beer, 5 ounces of wine, or 1 ounces  of hard liquor.  Do not use street drugs.  Do not share needles.  Ask your health care provider for help if you need support or information about quitting drugs.  Tell your health care provider if you often feel depressed.  Tell your health care provider if you have ever been abused or do not feel safe at home. This information is not intended to replace advice given to you by your health care provider. Make sure you discuss any questions you have with your health care provider. Document Released: 10/02/2010 Document Revised: 08/25/2015 Document Reviewed: 12/21/2014 Elsevier Interactive Patient Education  [employer].

## 2016-12-21 ENCOUNTER — Encounter: Payer: Self-pay | Admitting: Internal Medicine

## 2017-02-27 ENCOUNTER — Telehealth: Payer: Self-pay | Admitting: Family Medicine

## 2017-02-27 DIAGNOSIS — G510 Bell's palsy: Secondary | ICD-10-CM

## 2017-02-27 DIAGNOSIS — H04201 Unspecified epiphora, right lacrimal gland: Secondary | ICD-10-CM

## 2017-02-27 NOTE — Telephone Encounter (Signed)
Copied from CRM [phone]. Topic: Referral - Request >> Feb 27, 2017 10:07 AM Crist InfanteHarrald, Kathy J wrote: Reason for CRM: URGENT referral [facility] called to request an urgent referral for pt. Pt has appt today at 1 pm.  When they looked up her insurance info, pt does need the referral. Please call Lanora Manislizabeth at this office  [phone] Fax : [phone]  [facility] [address] [place], [place]  [id number] Dr Lucianne MussKumar NPI  [phone]

## 2017-02-27 NOTE — Telephone Encounter (Signed)
Per Dr Fabian SharpPanosh, Molli Knockkay to send patient to different Retinal Specialist.  Referral placed for urgent appt (pt being seen today)  Please call Lanora ManisElizabeth at this office  [phone] Fax : [phone]  Dr [facility] [address] [place], [place]  [id number] Dr Lucianne MussKumar NPI  [phone]

## 2017-05-24 ENCOUNTER — Encounter: Payer: Self-pay | Admitting: Gastroenterology

## 2017-10-11 ENCOUNTER — Encounter: Payer: 59 | Admitting: Internal Medicine

## 2017-11-04 ENCOUNTER — Encounter: Payer: 59 | Admitting: Internal Medicine

## 2017-11-12 NOTE — Progress Notes (Signed)
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 live in a cold climate or you do not get outside often, your health care provider may recommend that you take vitamin D supplements. Good sources of vitamin D in your diet include:  Egg yolks.  Saltwater fish.  Milk and cereal fortified with vitamin D.  Follow these recommended amounts for daily vitamin D intake:  Children and teens, age 783?18: 45 international units.  Adults, age 25 or younger: 400-800 international units.  Adults, age 39 or older: 800-1,000 international units.  Other Nutrients Other nutrients for bone health include:  Phosphorus. This mineral is found in meat, poultry, dairy foods, nuts, and legumes. The recommended daily intake for adult men and adult women is 700 mg.  Magnesium. This mineral is found in seeds, nuts, dark green vegetables, and legumes. The recommended daily  intake for adult men is 400?420 mg. For adult women, it is 310?320 mg.  Vitamin K. This vitamin is found in green leafy vegetables. The recommended daily intake is 120 mg for adult men and 90 mg for adult women.  What type of physical activity is best for building and maintaining healthy bones? Weight-bearing and strength-building activities are important for building and maintaining peak bone mass. Weight-bearing activities cause muscles and bones to work against gravity. Strength-building activities increases muscle strength that supports bones. Weight-bearing and muscle-building activities include:  Walking and hiking.  Jogging and running.  Dancing.  Gym exercises.  Lifting weights.  Tennis and racquetball.  Climbing stairs.  Aerobics.  Adults should get at least 30 minutes of moderate physical activity on most days. Children should get at least 60 minutes of moderate physical activity on most days. Ask your health care provide what type of exercise is best for you. Where can I find more information? For more information, check out the following websites:  [place]: [employer]se  [employer]: http://www.niams.AnonymousEar.fr.asp  This information is not intended to replace advice given to you by your health care provider. Make sure you discuss any questions you have with your health care provider. Document Released: 06/09/2003 Document Revised: 10/07/2015 Document Reviewed: 03/24/2014 Elsevier Interactive Patient Education  2018 [place]. Panosh M.D.

## 2017-11-12 NOTE — Patient Instructions (Addendum)
Call the [facility] department for your colonoscopy due  .  Try ibuprofen for the right side chest pain . And if  persistent or progressive consider x ray and getting sprorts medicine to see you.      Health Maintenance, Female Adopting a healthy lifestyle and getting preventive care can go a long way to promote health and wellness. Talk with your health care provider about what schedule of regular examinations is right for you. This is a good chance for you to check in with your provider about disease prevention and staying healthy. In between checkups, there are plenty of things you can do on your own. Experts have done a lot of research about which lifestyle changes and preventive measures are most likely to keep you healthy. Ask your health care provider for more information. Weight and diet Eat a healthy diet  Be sure to include plenty of vegetables, fruits, low-fat dairy products, and lean protein.  Do not eat a lot of foods high in solid fats, added sugars, or salt.  Get regular exercise. This is one of the most important things you can do for your health. ? Most adults should exercise for at least 150 minutes each week. The exercise should increase your heart rate and make you sweat (moderate-intensity exercise). ? Most adults should also do strengthening exercises at least twice a week. This is in addition to the moderate-intensity exercise.  Maintain a healthy weight  Body mass index (BMI) is a measurement that can be used to identify possible weight problems. It estimates body fat based on height and weight. Your health care provider can help determine your BMI and help you achieve or maintain a healthy weight.  For females 20 years of age and older: ? A BMI below 18.5 is considered underweight. ? A BMI of 18.5 to 24.9 is normal. ? A BMI of 25 to 29.9 is considered overweight. ? A BMI of 30 and above is considered obese.  Watch levels of cholesterol and blood lipids  You  should start having your blood tested for lipids and cholesterol at 55 years of age, then have this test every 5 years.  You may need to have your cholesterol levels checked more often if: ? Your lipid or cholesterol levels are high. ? You are older than 55 years of age. ? You are at high risk for heart disease.  Cancer screening Lung Cancer  Lung cancer screening is recommended for adults 56-27 years old who are at high risk for lung cancer because of a history of smoking.  A yearly low-dose CT scan of the lungs is recommended for people who: ? Currently smoke. ? Have quit within the past 15 years. ? Have at least a 30-pack-year history of smoking. A pack year is smoking an average of one pack of cigarettes a day for 1 year.  Yearly screening should continue until it has been 15 years since you quit.  Yearly screening should stop if you develop a health problem that would prevent you from having lung cancer treatment.  Breast Cancer  Practice breast self-awareness. This means understanding how your breasts normally appear and feel.  It also means doing regular breast self-exams. Let your health care provider know about any changes, no matter how small.  If you are in your 20s or 30s, you should have a clinical breast exam (CBE) by a health care provider every 1-3 years as part of a regular health exam.  If you are 40  or older, have a CBE every year. Also consider having a breast X-ray (mammogram) every year.  If you have a family history of breast cancer, talk to your health care provider about genetic screening.  If you are at high risk for breast cancer, talk to your health care provider about having an MRI and a mammogram every year.  Breast cancer gene (BRCA) assessment is recommended for women who have family members with BRCA-related cancers. BRCA-related cancers include: ? Breast. ? Ovarian. ? Tubal. ? Peritoneal cancers.  Results of the assessment will determine the  need for genetic counseling and BRCA1 and BRCA2 testing.  Cervical Cancer Your health care provider may recommend that you be screened regularly for cancer of the pelvic organs (ovaries, uterus, and vagina). This screening involves a pelvic examination, including checking for microscopic changes to the surface of your cervix (Pap test). You may be encouraged to have this screening done every 3 years, beginning at age 22.  For women ages 38-65, health care providers may recommend pelvic exams and Pap testing every 3 years, or they may recommend the Pap and pelvic exam, combined with testing for human papilloma virus (HPV), every 5 years. Some types of HPV increase your risk of cervical cancer. Testing for HPV may also be done on women of any age with unclear Pap test results.  Other health care providers may not recommend any screening for nonpregnant women who are considered low risk for pelvic cancer and who do not have symptoms. Ask your health care provider if a screening pelvic exam is right for you.  If you have had past treatment for cervical cancer or a condition that could lead to cancer, you need Pap tests and screening for cancer for at least 20 years after your treatment. If Pap tests have been discontinued, your risk factors (such as having a new sexual partner) need to be reassessed to determine if screening should resume. Some women have medical problems that increase the chance of getting cervical cancer. In these cases, your health care provider may recommend more frequent screening and Pap tests.  Colorectal Cancer  This type of cancer can be detected and often prevented.  Routine colorectal cancer screening usually begins at 55 years of age and continues through 55 years of age.  Your health care provider may recommend screening at an earlier age if you have risk factors for colon cancer.  Your health care provider may also recommend using home test kits to check for hidden blood  in the stool.  A small camera at the end of a tube can be used to examine your colon directly (sigmoidoscopy or colonoscopy). This is done to check for the earliest forms of colorectal cancer.  Routine screening usually begins at age 63.  Direct examination of the colon should be repeated every 5-10 years through 55 years of age. However, you may need to be screened more often if early forms of precancerous polyps or small growths are found.  Skin Cancer  Check your skin from head to toe regularly.  Tell your health care provider about any new moles or changes in moles, especially if there is a change in a mole's shape or color.  Also tell your health care provider if you have a mole that is larger than the size of a pencil eraser.  Always use sunscreen. Apply sunscreen liberally and repeatedly throughout the day.  Protect yourself by wearing long sleeves, pants, a wide-brimmed hat, and sunglasses whenever you are  outside.  Heart disease, diabetes, and high blood pressure  High blood pressure causes heart disease and increases the risk of stroke. High blood pressure is more likely to develop in: ? People who have blood pressure in the high end of the normal range (130-139/85-89 mm Hg). ? People who are overweight or obese. ? People who are African American.  If you are 18-39 years of age, have your blood pressure checked every 3-5 years. If you are 40 years of age or older, have your blood pressure checked every year. You should have your blood pressure measured twice-once when you are at a hospital or clinic, and once when you are not at a hospital or clinic. Record the average of the two measurements. To check your blood pressure when you are not at a hospital or clinic, you can use: ? An automated blood pressure machine at a pharmacy. ? A home blood pressure monitor.  If you are between 55 years and 79 years old, ask your health care provider if you should take aspirin to prevent  strokes.  Have regular diabetes screenings. This involves taking a blood sample to check your fasting blood sugar level. ? If you are at a normal weight and have a low risk for diabetes, have this test once every three years after 55 years of age. ? If you are overweight and have a high risk for diabetes, consider being tested at a younger age or more often. Preventing infection Hepatitis B  If you have a higher risk for hepatitis B, you should be screened for this virus. You are considered at high risk for hepatitis B if: ? You were born in a country where hepatitis B is common. Ask your health care provider which countries are considered high risk. ? Your parents were born in a high-risk country, and you have not been immunized against hepatitis B (hepatitis B vaccine). ? You have HIV or AIDS. ? You use needles to inject street drugs. ? You live with someone who has hepatitis B. ? You have had sex with someone who has hepatitis B. ? You get hemodialysis treatment. ? You take certain medicines for conditions, including cancer, organ transplantation, and autoimmune conditions.  Hepatitis C  Blood testing is recommended for: ? Everyone born from 1945 through 1965. ? Anyone with known risk factors for hepatitis C.  Sexually transmitted infections (STIs)  You should be screened for sexually transmitted infections (STIs) including gonorrhea and chlamydia if: ? You are sexually active and are younger than 55 years of age. ? You are older than 55 years of age and your health care provider tells you that you are at risk for this type of infection. ? Your sexual activity has changed since you were last screened and you are at an increased risk for chlamydia or gonorrhea. Ask your health care provider if you are at risk.  If you do not have HIV, but are at risk, it may be recommended that you take a prescription medicine daily to prevent HIV infection. This is called pre-exposure prophylaxis  (PrEP). You are considered at risk if: ? You are sexually active and do not regularly use condoms or know the HIV status of your partner(s). ? You take drugs by injection. ? You are sexually active with a partner who has HIV.  Talk with your health care provider about whether you are at high risk of being infected with HIV. If you choose to begin PrEP, you should first be tested   for HIV. You should then be tested every 3 months for as long as you are taking PrEP. Pregnancy  If you are premenopausal and you may become pregnant, ask your health care provider about preconception counseling.  If you may become pregnant, take 400 to 800 micrograms (mcg) of folic acid every day.  If you want to prevent pregnancy, talk to your health care provider about birth control (contraception). Osteoporosis and menopause  Osteoporosis is a disease in which the bones lose minerals and strength with aging. This can result in serious bone fractures. Your risk for osteoporosis can be identified using a bone density scan.  If you are 2 years of age or older, or if you are at risk for osteoporosis and fractures, ask your health care provider if you should be screened.  Ask your health care provider whether you should take a calcium or vitamin D supplement to lower your risk for osteoporosis.  Menopause may have certain physical symptoms and risks.  Hormone replacement therapy may reduce some of these symptoms and risks. Talk to your health care provider about whether hormone replacement therapy is right for you. Follow these instructions at home:  Schedule regular health, dental, and eye exams.  Stay current with your immunizations.  Do not use any tobacco products including cigarettes, chewing tobacco, or electronic cigarettes.  If you are pregnant, do not drink alcohol.  If you are breastfeeding, limit how much and how often you drink alcohol.  Limit alcohol intake to no more than 1 drink per day for  nonpregnant women. One drink equals 12 ounces of beer, 5 ounces of wine, or 1 ounces of hard liquor.  Do not use street drugs.  Do not share needles.  Ask your health care provider for help if you need support or information about quitting drugs.  Tell your health care provider if you often feel depressed.  Tell your health care provider if you have ever been abused or do not feel safe at home. This information is not intended to replace advice given to you by your health care provider. Make sure you discuss any questions you have with your health care provider. Document Released: 10/02/2010 Document Revised: 08/25/2015 Document Reviewed: 12/21/2014 Elsevier Interactive Patient Education  2018 [place] protect organs, store calcium, and anchor muscles. Good health habits, such as eating nutritious foods and exercising regularly, are important for maintaining healthy bones. They can also help to prevent a condition that causes bones to lose density and become weak and brittle (osteoporosis). Why is bone mass important? Bone mass refers to the amount of bone tissue that you have. The higher your bone mass, the stronger your bones. An important step toward having healthy bones throughout life is to have strong and dense bones during childhood. A young adult who has a high bone mass is more likely to have a high bone mass later in life. Bone mass at its greatest it is called peak bone mass. A large decline in bone mass occurs in older adults. In women, it occurs about the time of menopause. During this time, it is important to practice good health habits, because if more bone is lost than what is replaced, the bones will become less healthy and more likely to break (fracture). If you find that you have a low bone mass, you may be able to prevent osteoporosis or further bone loss by changing your diet and lifestyle. How can I find out if  my bone mass is low? Bone mass  can be measured with an X-ray test that is called a bone mineral density (BMD) test. This test is recommended for all women who are age 233 or older. It may also be recommended for men who are age 29 or older, or for people who are more likely to develop osteoporosis due to:  Having bones that break easily.  Having a long-term disease that weakens bones, such as kidney disease or rheumatoid arthritis.  Having menopause earlier than normal.  Taking medicine that weakens bones, such as steroids, thyroid hormones, or hormone treatment for breast cancer or prostate cancer.  Smoking.  Drinking three or more alcoholic drinks each day.  What are the nutritional recommendations for healthy bones? To have healthy bones, you need to get enough of the right minerals and vitamins. Most nutrition experts recommend getting these nutrients from the foods that you eat. Nutritional recommendations vary from person to person. Ask your health care provider what is healthy for you. Here are some general guidelines. Calcium Recommendations Calcium is the most important (essential) mineral for bone health. Most people can get enough calcium from their diet, but supplements may be recommended for people who are at risk for osteoporosis. Good sources of calcium include:  Dairy products, such as low-fat or nonfat milk, cheese, and yogurt.  Dark green leafy vegetables, such as bok choy and broccoli.  Calcium-fortified foods, such as orange juice, cereal, bread, soy beverages, and tofu products.  Nuts, such as almonds.  Follow these recommended amounts for daily calcium intake:  Children, age 23?3: 700 mg.  Children, age 8?8: 1,000 mg.  Children, age 67?13: 1,300 mg.  Teens, age 90?18: 1,300 mg.  Adults, age 73?50: 1,000 mg.  Adults, age 232?70: ? Men: 1,000 mg. ? Women: 1,200 mg.  Adults, age 236 or older: 1,200 mg.  Pregnant and breastfeeding females: ? Teens: 1,300 mg. ? Adults: 1,000  mg.  Vitamin D Recommendations Vitamin D is the most essential vitamin for bone health. It helps the body to absorb calcium. Sunlight stimulates the skin to make vitamin D, so be sure to get enough sunlight. If you live in a cold climate or you do not get outside often, your health care provider may recommend that you take vitamin D supplements. Good sources of vitamin D in your diet include:  Egg yolks.  Saltwater fish.  Milk and cereal fortified with vitamin D.  Follow these recommended amounts for daily vitamin D intake:  Children and teens, age 238?18: 21 international units.  Adults, age 65 or younger: 400-800 international units.  Adults, age 81 or older: 800-1,000 international units.  Other Nutrients Other nutrients for bone health include:  Phosphorus. This mineral is found in meat, poultry, dairy foods, nuts, and legumes. The recommended daily intake for adult men and adult women is 700 mg.  Magnesium. This mineral is found in seeds, nuts, dark green vegetables, and legumes. The recommended daily intake for adult men is 400?420 mg. For adult women, it is 310?320 mg.  Vitamin K. This vitamin is found in green leafy vegetables. The recommended daily intake is 120 mg for adult men and 90 mg for adult women.  What type of physical activity is best for building and maintaining healthy bones? Weight-bearing and strength-building activities are important for building and maintaining peak bone mass. Weight-bearing activities cause muscles and bones to work against gravity. Strength-building activities increases muscle strength that supports bones. Weight-bearing and muscle-building activities include:  Walking and hiking.  Jogging and running.  Dancing.  Gym exercises.  Lifting weights.  Tennis and racquetball.  Climbing stairs.  Aerobics.  Adults should get at least 30 minutes of moderate physical activity on most days. Children should get at least 60 minutes of  moderate physical activity on most days. Ask your health care provide what type of exercise is best for you. Where can I find more information? For more information, check out the following websites:  [place]: [employer]se  [employer]: http://www.niams.AnonymousEar.fr.asp  This information is not intended to replace advice given to you by your health care provider. Make sure you discuss any questions you have with your health care provider. Document Released: 06/09/2003 Document Revised: 10/07/2015 Document Reviewed: 03/24/2014 Elsevier Interactive Patient Education  [employer].

## 2017-11-14 ENCOUNTER — Ambulatory Visit (INDEPENDENT_AMBULATORY_CARE_PROVIDER_SITE_OTHER): Payer: 59 | Admitting: Internal Medicine

## 2017-11-14 ENCOUNTER — Encounter: Payer: Self-pay | Admitting: Internal Medicine

## 2017-11-14 VITALS — BP 100/64 | HR 60 | Temp 97.8°F | Ht 67.25 in | Wt 137.0 lb

## 2017-11-14 DIAGNOSIS — Z0000 Encounter for general adult medical examination without abnormal findings: Secondary | ICD-10-CM | POA: Diagnosis not present

## 2017-11-14 DIAGNOSIS — M8580 Other specified disorders of bone density and structure, unspecified site: Secondary | ICD-10-CM | POA: Diagnosis not present

## 2017-11-14 DIAGNOSIS — E7800 Pure hypercholesterolemia, unspecified: Secondary | ICD-10-CM

## 2017-11-14 DIAGNOSIS — Z780 Asymptomatic menopausal state: Secondary | ICD-10-CM | POA: Diagnosis not present

## 2017-11-14 DIAGNOSIS — R079 Chest pain, unspecified: Secondary | ICD-10-CM

## 2017-11-14 LAB — CBC WITH DIFFERENTIAL/PLATELET
BASOS ABS: 0 10*3/uL (ref 0.0–0.1)
Basophils Relative: 0.8 % (ref 0.0–3.0)
EOS ABS: 0.3 10*3/uL (ref 0.0–0.7)
Eosinophils Relative: 5.6 % — ABNORMAL HIGH (ref 0.0–5.0)
HCT: 41.8 % (ref 36.0–46.0)
Hemoglobin: 13.9 g/dL (ref 12.0–15.0)
LYMPHS ABS: 1.4 10*3/uL (ref 0.7–4.0)
Lymphocytes Relative: 29.4 % (ref 12.0–46.0)
MCHC: 33.2 g/dL (ref 30.0–36.0)
MCV: 89.1 fl (ref 78.0–100.0)
MONO ABS: 0.3 10*3/uL (ref 0.1–1.0)
Monocytes Relative: 7.1 % (ref 3.0–12.0)
NEUTROS PCT: 57.1 % (ref 43.0–77.0)
Neutro Abs: 2.8 10*3/uL (ref 1.4–7.7)
Platelets: 222 10*3/uL (ref 150.0–400.0)
RBC: 4.69 Mil/uL (ref 3.87–5.11)
RDW: 15.2 % (ref 11.5–15.5)
WBC: 4.8 10*3/uL (ref 4.0–10.5)

## 2017-11-14 LAB — BASIC METABOLIC PANEL
BUN: 18 mg/dL (ref 6–23)
CALCIUM: 10.2 mg/dL (ref 8.4–10.5)
CO2: 32 mEq/L (ref 19–32)
CREATININE: 0.84 mg/dL (ref 0.40–1.20)
Chloride: 103 mEq/L (ref 96–112)
GFR: 74.73 mL/min (ref >60.00)
GLUCOSE: 97 mg/dL (ref 70–99)
Potassium: 4.4 mEq/L (ref 3.5–5.1)
Sodium: 140 mEq/L (ref 135–145)

## 2017-11-14 LAB — HEPATIC FUNCTION PANEL
ALT: 21 U/L (ref 0–35)
AST: 25 U/L (ref 0–37)
Albumin: 4.4 g/dL (ref 3.5–5.2)
Alkaline Phosphatase: 73 U/L (ref 39–117)
BILIRUBIN TOTAL: 0.7 mg/dL (ref 0.2–1.2)
Bilirubin, Direct: 0.1 mg/dL (ref 0.0–0.3)
Total Protein: 7.4 g/dL (ref 6.0–8.3)

## 2017-11-14 LAB — LIPID PANEL
CHOLESTEROL: 222 mg/dL — AB (ref 0–200)
HDL: 96.2 mg/dL (ref >39.00)
LDL CALC: 115 mg/dL — AB (ref 0–99)
NonHDL: 125.5
Total CHOL/HDL Ratio: 2
Triglycerides: 54 mg/dL (ref 0.0–149.0)
VLDL: 10.8 mg/dL (ref 0.0–40.0)

## 2017-11-14 LAB — TSH: TSH: 1.32 u[IU]/mL (ref 0.35–4.50)
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
## 2018-12-03 ENCOUNTER — Encounter: Payer: Self-pay | Admitting: Internal Medicine

## 2018-12-03 ENCOUNTER — Ambulatory Visit (INDEPENDENT_AMBULATORY_CARE_PROVIDER_SITE_OTHER): Payer: 59 | Admitting: Internal Medicine

## 2018-12-03 ENCOUNTER — Other Ambulatory Visit: Payer: Self-pay

## 2018-12-03 VITALS — BP 114/62 | HR 62 | Temp 98.2°F | Ht 67.75 in | Wt 133.0 lb

## 2018-12-03 DIAGNOSIS — Z23 Encounter for immunization: Secondary | ICD-10-CM | POA: Diagnosis not present

## 2018-12-03 DIAGNOSIS — Z0000 Encounter for general adult medical examination without abnormal findings: Secondary | ICD-10-CM

## 2018-12-03 DIAGNOSIS — Z79899 Other long term (current) drug therapy: Secondary | ICD-10-CM

## 2018-12-03 DIAGNOSIS — Z1211 Encounter for screening for malignant neoplasm of colon: Secondary | ICD-10-CM | POA: Diagnosis not present

## 2018-12-03 LAB — LIPID PANEL
Cholesterol: 219 mg/dL — ABNORMAL HIGH (ref 0–200)
HDL: 82.5 mg/dL (ref >39.00)
LDL Cholesterol: 129 mg/dL — ABNORMAL HIGH (ref 0–99)
NonHDL: 136.55
Total CHOL/HDL Ratio: 3
Triglycerides: 39 mg/dL (ref 0.0–149.0)
VLDL: 7.8 mg/dL (ref 0.0–40.0)

## 2018-12-03 LAB — CBC WITH DIFFERENTIAL/PLATELET
Basophils Absolute: 0 10*3/uL (ref 0.0–0.1)
Basophils Relative: 0.6 % (ref 0.0–3.0)
Eosinophils Absolute: 0.1 10*3/uL (ref 0.0–0.7)
Eosinophils Relative: 2.8 % (ref 0.0–5.0)
HCT: 40.8 % (ref 36.0–46.0)
Hemoglobin: 13.5 g/dL (ref 12.0–15.0)
Lymphocytes Relative: 34.1 % (ref 12.0–46.0)
Lymphs Abs: 1.6 10*3/uL (ref 0.7–4.0)
MCHC: 33.2 g/dL (ref 30.0–36.0)
MCV: 91.9 fl (ref 78.0–100.0)
Monocytes Absolute: 0.3 10*3/uL (ref 0.1–1.0)
Monocytes Relative: 6.8 % (ref 3.0–12.0)
Neutro Abs: 2.6 10*3/uL (ref 1.4–7.7)
Neutrophils Relative %: 55.7 % (ref 43.0–77.0)
Platelets: 203 10*3/uL (ref 150.0–400.0)
RBC: 4.44 Mil/uL (ref 3.87–5.11)
RDW: 14.3 % (ref 11.5–15.5)
WBC: 4.7 10*3/uL (ref 4.0–10.5)

## 2018-12-03 LAB — HEPATIC FUNCTION PANEL
ALT: 21 U/L (ref 0–35)
AST: 26 U/L (ref 0–37)
Albumin: 4.2 g/dL (ref 3.5–5.2)
Alkaline Phosphatase: 70 U/L (ref 39–117)
Bilirubin, Direct: 0.1 mg/dL (ref 0.0–0.3)
Total Bilirubin: 0.7 mg/dL (ref 0.2–1.2)
Total Protein: 7.2 g/dL (ref 6.0–8.3)

## 2018-12-03 LAB — BASIC METABOLIC PANEL
BUN: 18 mg/dL (ref 6–23)
CO2: 30 mEq/L (ref 19–32)
Calcium: 9.6 mg/dL (ref 8.4–10.5)
Chloride: 104 mEq/L (ref 96–112)
Creatinine, Ser: 0.74 mg/dL (ref 0.40–1.20)
GFR: 81.07 mL/min (ref >60.00)
Glucose, Bld: 88 mg/dL (ref 70–99)
Potassium: 4.4 mEq/L (ref 3.5–5.1)
Sodium: 140 mEq/L (ref 135–145)

## 2018-12-03 LAB — TSH: TSH: 1.65 u[IU]/mL (ref 0.35–4.50)

## 2018-12-03 NOTE — Patient Instructions (Signed)
Glad you are doing well.  Continue lifestyle intervention healthy eating and exercise .  Your   last vit d level was normal    Will do a referral for colonoscopy.    Preventive Care 56-56 Years Old, Female Preventive care refers to visits with your health care provider and lifestyle choices that can promote health and wellness. This includes:  A yearly physical exam. This may also be called an annual well check.  Regular dental visits and eye exams.  Immunizations.  Screening for certain conditions.  Healthy lifestyle choices, such as eating a healthy diet, getting regular exercise, not using drugs or products that contain nicotine and tobacco, and limiting alcohol use. What can I expect for my preventive care visit? Physical exam Your health care provider will check your:  Height and weight. This may be used to calculate body mass index (BMI), which tells if you are at a healthy weight.  Heart rate and blood pressure.  Skin for abnormal spots. Counseling Your health care provider may ask you questions about your:  Alcohol, tobacco, and drug use.  Emotional well-being.  Home and relationship well-being.  Sexual activity.  Eating habits.  Work and work Statistician.  Method of birth control.  Menstrual cycle.  Pregnancy history. What immunizations do I need?  Influenza (flu) vaccine  This is recommended every year. Tetanus, diphtheria, and pertussis (Tdap) vaccine  You may need a Td booster every 10 years. Varicella (chickenpox) vaccine  You may need this if you have not been vaccinated. Zoster (shingles) vaccine  You may need this after age 29. Measles, mumps, and rubella (MMR) vaccine  You may need at least one dose of MMR if you were born in 1957 or later. You may also need a second dose. Pneumococcal conjugate (PCV13) vaccine  You may need this if you have certain conditions and were not previously vaccinated. Pneumococcal polysaccharide (PPSV23)  vaccine  You may need one or two doses if you smoke cigarettes or if you have certain conditions. Meningococcal conjugate (MenACWY) vaccine  You may need this if you have certain conditions. Hepatitis A vaccine  You may need this if you have certain conditions or if you travel or work in places where you may be exposed to hepatitis A. Hepatitis B vaccine  You may need this if you have certain conditions or if you travel or work in places where you may be exposed to hepatitis B. Haemophilus influenzae type b (Hib) vaccine  You may need this if you have certain conditions. Human papillomavirus (HPV) vaccine  If recommended by your health care provider, you may need three doses over 6 months. You may receive vaccines as individual doses or as more than one vaccine together in one shot (combination vaccines). Talk with your health care provider about the risks and benefits of combination vaccines. What tests do I need? Blood tests  Lipid and cholesterol levels. These may be checked every 5 years, or more frequently if you are over 13 years old.  Hepatitis C test.  Hepatitis B test. Screening  Lung cancer screening. You may have this screening every year starting at age 54 if you have a 30-pack-year history of smoking and currently smoke or have quit within the past 15 years.  Colorectal cancer screening. All adults should have this screening starting at age 65 and continuing until age 43. Your health care provider may recommend screening at age 81 if you are at increased risk. You will have tests every 1-10  years, depending on your results and the type of screening test.  Diabetes screening. This is done by checking your blood sugar (glucose) after you have not eaten for a while (fasting). You may have this done every 1-3 years.  Mammogram. This may be done every 1-2 years. Talk with your health care provider about when you should start having regular mammograms. This may depend on  whether you have a family history of breast cancer.  BRCA-related cancer screening. This may be done if you have a family history of breast, ovarian, tubal, or peritoneal cancers.  Pelvic exam and Pap test. This may be done every 3 years starting at age 47. Starting at age 63, this may be done every 5 years if you have a Pap test in combination with an HPV test. Other tests  Sexually transmitted disease (STD) testing.  Bone density scan. This is done to screen for osteoporosis. You may have this scan if you are at high risk for osteoporosis. Follow these instructions at home: Eating and drinking  Eat a diet that includes fresh fruits and vegetables, whole grains, lean protein, and low-fat dairy.  Take vitamin and mineral supplements as recommended by your health care provider.  Do not drink alcohol if: ? Your health care provider tells you not to drink. ? You are pregnant, may be pregnant, or are planning to become pregnant.  If you drink alcohol: ? Limit how much you have to 0-1 drink a day. ? Be aware of how much alcohol is in your drink. In the [place] one drink equals one 12 oz bottle of beer (355 mL), one 5 oz glass of wine (148 mL), or one 1 oz glass of hard liquor (44 mL). Lifestyle  Take daily care of your teeth and gums.  Stay active. Exercise for at least 30 minutes on 5 or more days each week.  Do not use any products that contain nicotine or tobacco, such as cigarettes, e-cigarettes, and chewing tobacco. If you need help quitting, ask your health care provider.  If you are sexually active, practice safe sex. Use a condom or other form of birth control (contraception) in order to prevent pregnancy and STIs (sexually transmitted infections).  If told by your health care provider, take low-dose aspirin daily starting at age 20. What's next?  Visit your health care provider once a year for a well check visit.  Ask your health care provider how often you should have your  eyes and teeth checked.  Stay up to date on all vaccines. This information is not intended to replace advice given to you by your health care provider. Make sure you discuss any questions you have with your health care provider. Document Released: 04/15/2015 Document Revised: 11/28/2017 Document Reviewed: 11/28/2017 Elsevier Patient Education  2020 [employer].

## 2018-12-04 ENCOUNTER — Encounter: Payer: Self-pay | Admitting: Gastroenterology

## 2018-12-29 ENCOUNTER — Ambulatory Visit (AMBULATORY_SURGERY_CENTER): Payer: Self-pay

## 2018-12-29 ENCOUNTER — Other Ambulatory Visit: Payer: Self-pay

## 2018-12-29 VITALS — Temp 96.8°F | Ht 68.0 in | Wt 134.0 lb

## 2018-12-29 DIAGNOSIS — Z1211 Encounter for screening for malignant neoplasm of colon: Secondary | ICD-10-CM

## 2018-12-29 MED ORDER — PEG 3350-KCL-NA BICARB-NACL 420 G PO SOLR: 4000.0000 mL | Freq: Once | ORAL | 0 refills | Status: AC

## 2018-12-29 NOTE — Progress Notes (Signed)
Denies allergies to eggs or soy products. Denies complication of anesthesia or sedation. Denies use of weight loss medication. Denies use of O2.   Emmi instructions given for colonoscopy.  

## 2019-01-02 ENCOUNTER — Encounter: Payer: Self-pay | Admitting: Gastroenterology

## 2019-01-16 ENCOUNTER — Encounter: Payer: 59 | Admitting: Gastroenterology

## 2019-02-11 ENCOUNTER — Encounter: Payer: Self-pay | Admitting: Gastroenterology

## 2019-02-11 ENCOUNTER — Ambulatory Visit (AMBULATORY_SURGERY_CENTER): Payer: 59 | Admitting: Gastroenterology

## 2019-02-11 ENCOUNTER — Other Ambulatory Visit: Payer: Self-pay

## 2019-02-11 VITALS — BP 104/85 | HR 49 | Temp 97.8°F | Resp 19 | Ht 68.0 in | Wt 134.0 lb

## 2019-02-11 DIAGNOSIS — Z1211 Encounter for screening for malignant neoplasm of colon: Secondary | ICD-10-CM | POA: Diagnosis not present

## 2019-02-11 MED ORDER — SODIUM CHLORIDE 0.9 % IV SOLN: 500.0000 mL | INTRAVENOUS | Status: DC

## 2019-02-11 NOTE — Progress Notes (Signed)
temp JB V/s CW  I have reviewed the patient's medical history in detail and updated the computerized patient record.   

## 2019-02-11 NOTE — Patient Instructions (Signed)
YOU HAD AN ENDOSCOPIC PROCEDURE TODAY AT [facility]:   Refer to the procedure report that was given to you for any specific questions about what was found during the examination.  If the procedure report does not answer your questions, please call your gastroenterologist to clarify.  If you requested that your care partner not be given the details of your procedure findings, then the procedure report has been included in a sealed envelope for you to review at your convenience later.  YOU SHOULD EXPECT: Some feelings of bloating in the abdomen. Passage of more gas than usual.  Walking can help get rid of the air that was put into your GI tract during the procedure and reduce the bloating. If you had a lower endoscopy (such as a colonoscopy or flexible sigmoidoscopy) you may notice spotting of blood in your stool or on the toilet paper. If you underwent a bowel prep for your procedure, you may not have a normal bowel movement for a few days.  Please Note:  You might notice some irritation and congestion in your nose or some drainage.  This is from the oxygen used during your procedure.  There is no need for concern and it should clear up in a day or so.  SYMPTOMS TO REPORT IMMEDIATELY:   Following lower endoscopy (colonoscopy or flexible sigmoidoscopy):  Excessive amounts of blood in the stool  Significant tenderness or worsening of abdominal pains  Swelling of the abdomen that is new, acute  Fever of 100F or higher  For urgent or emergent issues, a gastroenterologist can be reached at any hour by calling [phone].   DIET:  We do recommend a small meal at first, but then you may proceed to your regular diet.  Drink plenty of fluids but you should avoid alcoholic beverages for 24 hours.  ACTIVITY:  You should plan to take it easy for the rest of today and you should NOT DRIVE or use heavy machinery until tomorrow (because of the sedation medicines used during the test).     FOLLOW UP: Our staff will call the number listed on your records 48-72 hours following your procedure to check on you and address any questions or concerns that you may have regarding the information given to you following your procedure. If we do not reach you, we will leave a message.  We will attempt to reach you two times.  During this call, we will ask if you have developed any symptoms of COVID 19. If you develop any symptoms (ie: fever, flu-like symptoms, shortness of breath, cough etc.) before then, please call [phone].  If you test positive for Covid 19 in the 2 weeks post procedure, please call and report this information to [place].    SIGNATURES/CONFIDENTIALITY: You and/or your care partner have signed paperwork which will be entered into your electronic medical record.  These signatures attest to the fact that that the information above on your After Visit Summary has been reviewed and is understood.  Full responsibility of the confidentiality of this discharge information lies with you and/or your care-partner.  Next colonoscopy- 10 years  Continue your normal medications  Please read over handouts about hemorrhoids and high fiber diets

## 2019-02-11 NOTE — Progress Notes (Signed)
Report given to PACU, vss 

## 2019-02-11 NOTE — Op Note (Signed)
[facility] Patient Name: Tracey [name] Procedure Date: 02/11/2019 9:19 AM MRN: [id number] Endoscopist: Rachael Feeaniel P Jacobs , MD Age: 58 Referring MD:  Date of Birth: 07/25/1960 Gender: Female Account #: [id number] Procedure:                Colonoscopy Indications:              Screening for colorectal malignant neoplasm Medicines:                Monitored Anesthesia Care Procedure:                Pre-Anesthesia Assessment:                           - Prior to the procedure, a History and Physical                            was performed, and patient medications and                            allergies were reviewed. The patient's tolerance of                            previous anesthesia was also reviewed. The risks                            and benefits of the procedure and the sedation                            options and risks were discussed with the patient.                            All questions were answered, and informed consent                            was obtained. Prior Anticoagulants: The patient has                            taken no previous anticoagulant or antiplatelet                            agents. ASA Grade Assessment: II - A patient with                            mild systemic disease. After reviewing the risks                            and benefits, the patient was deemed in                            satisfactory condition to undergo the procedure.                           After obtaining informed consent, the colonoscope  was passed under direct vision. Throughout the                            procedure, the patient's blood pressure, pulse, and                            oxygen saturations were monitored continuously. The                            Colonoscope was introduced through the anus and                            advanced to the the cecum, identified by                            appendiceal orifice and  ileocecal valve. The                            colonoscopy was performed without difficulty. The                            patient tolerated the procedure well. The quality                            of the bowel preparation was good. The ileocecal                            valve, appendiceal orifice, and rectum were                            photographed. Scope In: 9:23:36 AM Scope Out: 9:36:53 AM Scope Withdrawal Time: 0 hours 7 minutes 30 seconds  Total Procedure Duration: 0 hours 13 minutes 17 seconds  Findings:                 Internal hemorrhoids were found. The hemorrhoids                            were medium-sized.                           The exam was otherwise without abnormality on                            direct and retroflexion views. Complications:            No immediate complications. Estimated blood loss:                            None. Estimated Blood Loss:     Estimated blood loss: none. Impression:               - Internal hemorrhoids.                           - The examination was otherwise normal on direct  and retroflexion views.                           - No polyps or cancers. Recommendation:           - Patient has a contact number available for                            emergencies. The signs and symptoms of potential                            delayed complications were discussed with the                            patient. Return to normal activities tomorrow.                            Written discharge instructions were provided to the                            patient.                           - Resume previous diet.                           - Continue present medications.                           - Repeat colonoscopy in 10 years for screening. Milus Banister, MD 02/11/2019 9:38:49 AM This report has been signed electronically.

## 2019-02-13 ENCOUNTER — Telehealth: Payer: Self-pay

## 2019-02-13 NOTE — Telephone Encounter (Signed)
  Follow up Call-  Call back number 02/11/2019  Post procedure Call Back phone  # [phone]  Permission to leave phone message Yes  Some recent data might be hidden     Patient questions:  Do you have a fever, pain , or abdominal swelling? No. Pain Score  0 *  Have you tolerated food without any problems? Yes.    Have you been able to return to your normal activities? Yes.    Do you have any questions about your discharge instructions: Diet   No. Medications  No. Follow up visit  No.  Do you have questions or concerns about your Care? No.  Actions: * If pain score is 4 or above: No action needed, pain <4.  1. Have you developed a fever since your procedure? no  2.   Have you had an respiratory symptoms (SOB or cough) since your procedure? no  3.   Have you tested positive for COVID 19 since your procedure no  4.   Have you had any family members/close contacts diagnosed with the COVID 19 since your procedure?  no   If yes to any of these questions please route to Joylene John, RN and Alphonsa Gin, Therapist, sports.

## 2019-03-04 ENCOUNTER — Other Ambulatory Visit: Payer: Self-pay

## 2019-03-04 ENCOUNTER — Encounter: Payer: Self-pay | Admitting: Internal Medicine

## 2019-03-04 ENCOUNTER — Ambulatory Visit: Payer: Self-pay | Admitting: *Deleted

## 2019-03-04 ENCOUNTER — Telehealth (INDEPENDENT_AMBULATORY_CARE_PROVIDER_SITE_OTHER): Payer: 59 | Admitting: Internal Medicine

## 2019-03-04 DIAGNOSIS — H01119 Allergic dermatitis of unspecified eye, unspecified eyelid: Secondary | ICD-10-CM

## 2019-03-04 NOTE — Telephone Encounter (Signed)
Pt has virtual visit fyi

## 2019-03-04 NOTE — Telephone Encounter (Signed)
See note
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
## 2019-03-04 NOTE — Telephone Encounter (Signed)
Pt called with complaints of allergic reaction: swelling around eyes, and rash around her eyes and neck; this has been intermittent since October 2020, but has worsened this week; she has not have any medications, and no changes in her diet, or detergent; she has taken Benadryl for itching; recommendations made per nurse triage protocol; she verbalized understanding; she verbalized understanding; the pt sees Dr Regis Bill, [facility]; pt transferred to [facility] for scheduling.     Reason for Disposition . [1] SEVERE eyelid swelling (i.e., shut or almost) AND [2] involves both eyes AND [3] itchy  Answer Assessment - Initial Assessment Questions 1. ONSET: "When did the swelling start?" (e.g., minutes, hours, days)     October 2020 2. LOCATION: "What part of the face is swollen?"     All around eyes L>R 3. SEVERITY: "How swollen is it?"    severe 4. ITCHING: "Is there any itching?" If so, ask: "How much?"   (Scale 1-10; mild, moderate or severe)     moderate 5. PAIN: "Is the swelling painful to touch?" If so, ask: "How painful is it?"   (Scale 1-10; mild, moderate or severe)    no 6. FEVER: "Do you have a fever?" If so, ask: "What is it, how was it measured, and when did it start?"     no 7. CAUSE: "What do you think is causing the face swelling?"     Allergic reaction 8. RECURRENT SYMPTOM: "Have you had face swelling before?" If so, ask: "When was the last time?" "What happened that time?"     Yes due to allergic to sunscreen 9. OTHER SYMPTOMS: "Do you have any other symptoms?" (e.g., toothache, leg swelling)    Blisters around eyes, redness; rash on neck (red) 10. PREGNANCY: "Is there any chance you are pregnant?" "When was your last menstrual period?"       no  Protocols used: EYE - Stevan Born, FACE [facility]
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
## 2019-12-11 ENCOUNTER — Other Ambulatory Visit: Payer: Self-pay

## 2019-12-11 ENCOUNTER — Encounter: Payer: Self-pay | Admitting: Internal Medicine

## 2019-12-11 ENCOUNTER — Ambulatory Visit (INDEPENDENT_AMBULATORY_CARE_PROVIDER_SITE_OTHER): Payer: 59 | Admitting: Internal Medicine

## 2019-12-11 VITALS — BP 102/62 | HR 59 | Temp 97.6°F | Ht 67.8 in | Wt 128.0 lb

## 2019-12-11 DIAGNOSIS — Z79899 Other long term (current) drug therapy: Secondary | ICD-10-CM

## 2019-12-11 DIAGNOSIS — E559 Vitamin D deficiency, unspecified: Secondary | ICD-10-CM

## 2019-12-11 DIAGNOSIS — Z0000 Encounter for general adult medical examination without abnormal findings: Secondary | ICD-10-CM | POA: Diagnosis not present

## 2019-12-11 DIAGNOSIS — Z23 Encounter for immunization: Secondary | ICD-10-CM | POA: Diagnosis not present

## 2019-12-11 MED ORDER — ACETAZOLAMIDE 250 MG PO TABS: ORAL_TABLET | ORAL | 0 refills | Status: DC

## 2019-12-11 NOTE — Patient Instructions (Addendum)
Continue lifestyle intervention healthy eating and exercise .  Can get second shingrix vaccine in 2-6  months .  Stay hydrated  Before ascent .  Medication     Health Maintenance, Female Adopting a healthy lifestyle and getting preventive care are important in promoting health and wellness. Ask your health care provider about:  The right schedule for you to have regular tests and exams.  Things you can do on your own to prevent diseases and keep yourself healthy. What should I know about diet, weight, and exercise? Eat a healthy diet   Eat a diet that includes plenty of vegetables, fruits, low-fat dairy products, and lean protein.  Do not eat a lot of foods that are high in solid fats, added sugars, or sodium. Maintain a healthy weight Body mass index (BMI) is used to identify weight problems. It estimates body fat based on height and weight. Your health care provider can help determine your BMI and help you achieve or maintain a healthy weight. Get regular exercise Get regular exercise. This is one of the most important things you can do for your health. Most adults should:  Exercise for at least 150 minutes each week. The exercise should increase your heart rate and make you sweat (moderate-intensity exercise).  Do strengthening exercises at least twice a week. This is in addition to the moderate-intensity exercise.  Spend less time sitting. Even light physical activity can be beneficial. Watch cholesterol and blood lipids Have your blood tested for lipids and cholesterol at 57 years of age, then have this test every 5 years. Have your cholesterol levels checked more often if:  Your lipid or cholesterol levels are high.  You are older than 57 years of age.  You are at high risk for heart disease. What should I know about cancer screening? Depending on your health history and family history, you may need to have cancer screening at various ages. This may include screening  for:  Breast cancer.  Cervical cancer.  Colorectal cancer.  Skin cancer.  Lung cancer. What should I know about heart disease, diabetes, and high blood pressure? Blood pressure and heart disease  High blood pressure causes heart disease and increases the risk of stroke. This is more likely to develop in people who have high blood pressure readings, are of African descent, or are overweight.  Have your blood pressure checked: ? Every 3-5 years if you are 69-76 years of age. ? Every year if you are 78 years old or older. Diabetes Have regular diabetes screenings. This checks your fasting blood sugar level. Have the screening done:  Once every three years after age 38 if you are at a normal weight and have a low risk for diabetes.  More often and at a younger age if you are overweight or have a high risk for diabetes. What should I know about preventing infection? Hepatitis B If you have a higher risk for hepatitis B, you should be screened for this virus. Talk with your health care provider to find out if you are at risk for hepatitis B infection. Hepatitis C Testing is recommended for:  Everyone born from [birth year] through 1963.  Anyone with known risk factors for hepatitis C. Sexually transmitted infections (STIs)  Get screened for STIs, including gonorrhea and chlamydia, if: ? You are sexually active and are younger than 56 years of age. ? You are older than 57 years of age and your health care provider tells you that you are at risk  for this type of infection. ? Your sexual activity has changed since you were last screened, and you are at increased risk for chlamydia or gonorrhea. Ask your health care provider if you are at risk.  Ask your health care provider about whether you are at high risk for HIV. Your health care provider may recommend a prescription medicine to help prevent HIV infection. If you choose to take medicine to prevent HIV, you should first get tested for HIV.  You should then be tested every 3 months for as long as you are taking the medicine. Pregnancy  If you are about to stop having your period (premenopausal) and you may become pregnant, seek counseling before you get pregnant.  Take 400 to 800 micrograms (mcg) of folic acid every day if you become pregnant.  Ask for birth control (contraception) if you want to prevent pregnancy. Osteoporosis and menopause Osteoporosis is a disease in which the bones lose minerals and strength with aging. This can result in bone fractures. If you are 57 years old or older, or if you are at risk for osteoporosis and fractures, ask your health care provider if you should:  Be screened for bone loss.  Take a calcium or vitamin D supplement to lower your risk of fractures.  Be given hormone replacement therapy (HRT) to treat symptoms of menopause. Follow these instructions at home: Lifestyle  Do not use any products that contain nicotine or tobacco, such as cigarettes, e-cigarettes, and chewing tobacco. If you need help quitting, ask your health care provider.  Do not use street drugs.  Do not share needles.  Ask your health care provider for help if you need support or information about quitting drugs. Alcohol use  Do not drink alcohol if: ? Your health care provider tells you not to drink. ? You are pregnant, may be pregnant, or are planning to become pregnant.  If you drink alcohol: ? Limit how much you use to 0-1 drink a day. ? Limit intake if you are breastfeeding.  Be aware of how much alcohol is in your drink. In the [place] one drink equals one 12 oz bottle of beer (355 mL), one 5 oz glass of wine (148 mL), or one 1 oz glass of hard liquor (44 mL). General instructions  Schedule regular health, dental, and eye exams.  Stay current with your vaccines.  Tell your health care provider if: ? You often feel depressed. ? You have ever been abused or do not feel safe at  home. Summary  Adopting a healthy lifestyle and getting preventive care are important in promoting health and wellness.  Follow your health care provider's instructions about healthy diet, exercising, and getting tested or screened for diseases.  Follow your health care provider's instructions on monitoring your cholesterol and blood pressure. This information is not intended to replace advice given to you by your health care provider. Make sure you discuss any questions you have with your health care provider. Document Revised: 03/12/2018 Document Reviewed: 03/12/2018 Elsevier Patient Education  2020 [employer].

## 2019-12-12 LAB — CBC WITH DIFFERENTIAL/PLATELET
Absolute Monocytes: 329 cells/uL (ref 200–950)
Basophils Absolute: 38 cells/uL (ref 0–200)
Basophils Relative: 0.8 %
Eosinophils Absolute: 141 cells/uL (ref 15–500)
Eosinophils Relative: 3 %
HCT: 40.4 % (ref 35.0–45.0)
Hemoglobin: 13 g/dL (ref 11.7–15.5)
Lymphs Abs: 1687 cells/uL (ref 850–3900)
MCH: 29.4 pg (ref 27.0–33.0)
MCHC: 32.2 g/dL (ref 32.0–36.0)
MCV: 91.4 fL (ref 80.0–100.0)
MPV: 11.8 fL (ref 7.5–12.5)
Monocytes Relative: 7 %
Neutro Abs: 2505 cells/uL (ref 1500–7800)
Neutrophils Relative %: 53.3 %
Platelets: 246 10*3/uL (ref 140–400)
RBC: 4.42 10*6/uL (ref 3.80–5.10)
RDW: 12.2 % (ref 11.0–15.0)
Total Lymphocyte: 35.9 %
WBC: 4.7 10*3/uL (ref 3.8–10.8)

## 2019-12-12 LAB — HEMOGLOBIN A1C
Hgb A1c MFr Bld: 5.8 % of total Hgb — ABNORMAL HIGH (ref <5.7)
Mean Plasma Glucose: 120 (calc)
eAG (mmol/L): 6.6 (calc)

## 2019-12-12 LAB — LIPID PANEL
Cholesterol: 219 mg/dL — ABNORMAL HIGH (ref <200)
HDL: 101 mg/dL (ref >=50)
LDL Cholesterol (Calc): 106 mg/dL (calc) — ABNORMAL HIGH
Non-HDL Cholesterol (Calc): 118 mg/dL (calc) (ref <130)
Total CHOL/HDL Ratio: 2.2 (calc) (ref <5.0)
Triglycerides: 41 mg/dL (ref <150)

## 2019-12-12 LAB — BASIC METABOLIC PANEL WITH GFR
BUN: 17 mg/dL (ref 7–25)
CO2: 29 mmol/L (ref 20–32)
Calcium: 9.3 mg/dL (ref 8.6–10.4)
Chloride: 105 mmol/L (ref 98–110)
Creat: 0.74 mg/dL (ref 0.50–1.05)
GFR, Est African American: 104 mL/min/{1.73_m2} (ref >=60)
GFR, Est Non African American: 90 mL/min/{1.73_m2} (ref >=60)
Glucose, Bld: 86 mg/dL (ref 65–99)
Potassium: 4.3 mmol/L (ref 3.5–5.3)
Sodium: 140 mmol/L (ref 135–146)

## 2019-12-12 LAB — HEPATIC FUNCTION PANEL
AG Ratio: 1.5 (calc) (ref 1.0–2.5)
ALT: 24 U/L (ref 6–29)
AST: 25 U/L (ref 10–35)
Albumin: 4.2 g/dL (ref 3.6–5.1)
Alkaline phosphatase (APISO): 78 U/L (ref 37–153)
Bilirubin, Direct: 0.2 mg/dL (ref 0.0–0.2)
Globulin: 2.8 g/dL (calc) (ref 1.9–3.7)
Indirect Bilirubin: 0.5 mg/dL (calc) (ref 0.2–1.2)
Total Bilirubin: 0.7 mg/dL (ref 0.2–1.2)
Total Protein: 7 g/dL (ref 6.1–8.1)

## 2019-12-12 LAB — VITAMIN D 25 HYDROXY (VIT D DEFICIENCY, FRACTURES): Vit D, 25-Hydroxy: 24 ng/mL — ABNORMAL LOW (ref 30–100)

## 2019-12-12 LAB — TSH: TSH: 1.53 mIU/L (ref 0.40–4.50)

## 2019-12-12 NOTE — Progress Notes (Signed)
Labs ok but vit d on low side  increase vit d  intake by 1000 iu per day  D3
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
## 2020-02-24 ENCOUNTER — Encounter: Payer: Self-pay | Admitting: Internal Medicine

## 2020-02-24 ENCOUNTER — Ambulatory Visit (INDEPENDENT_AMBULATORY_CARE_PROVIDER_SITE_OTHER): Payer: 59 | Admitting: Internal Medicine

## 2020-02-24 ENCOUNTER — Other Ambulatory Visit: Payer: Self-pay

## 2020-02-24 VITALS — BP 110/82 | HR 62 | Temp 97.8°F | Ht 67.8 in | Wt 132.0 lb

## 2020-02-24 DIAGNOSIS — G479 Sleep disorder, unspecified: Secondary | ICD-10-CM | POA: Diagnosis not present

## 2020-02-24 DIAGNOSIS — Z79899 Other long term (current) drug therapy: Secondary | ICD-10-CM

## 2020-02-24 DIAGNOSIS — F4323 Adjustment disorder with mixed anxiety and depressed mood: Secondary | ICD-10-CM

## 2020-02-24 DIAGNOSIS — Z566 Other physical and mental strain related to work: Secondary | ICD-10-CM | POA: Diagnosis not present

## 2020-02-24 MED ORDER — CLONAZEPAM 1 MG PO TABS: 0.5000 mg | ORAL_TABLET | Freq: Every evening | ORAL | 1 refills | Status: DC | PRN

## 2020-02-24 NOTE — Patient Instructions (Signed)
Can try   Med at night   This is a benzodiazepines for anxiety  But can help sleep if a problem with anxiety .   Potential for dependence with long term use.   Caution In day .  If you use try  In day .    Plan follow up contact in  Virtual   in December . Depending.     Stress, Adult Stress is a normal reaction to life events. Stress is what you feel when life demands more than you are used to, or more than you think you can handle. Some stress can be useful, such as studying for a test or meeting a deadline at work. Stress that occurs too often or for too long can cause problems. It can affect your emotional health and interfere with relationships and normal daily activities. Too much stress can weaken your body's defense system (immune system) and increase your risk for physical illness. If you already have a medical problem, stress can make it worse. What are the causes? All sorts of life events can cause stress. An event that causes stress for one person may not be stressful for another person. Major life events, whether positive or negative, commonly cause stress. Examples include:  Losing a job or starting a new job.  Losing a loved one.  Moving to a new town or home.  Getting married or divorced.  Having a baby.  Getting injured or sick. Less obvious life events can also cause stress, especially if they occur day after day or in combination with each other. Examples include:  Working long hours.  Driving in traffic.  Caring for children.  Being in debt.  Being in a difficult relationship. What are the signs or symptoms? Stress can cause emotional symptoms, including:  Anxiety. This is feeling worried, afraid, on edge, overwhelmed, or out of control.  Anger, including irritation or impatience.  Depression. This is feeling sad, down, helpless, or guilty.  Trouble focusing, remembering, or making decisions. Stress can cause physical symptoms, including:  Aches  and pains. These may affect your head, neck, back, stomach, or other areas of your body.  Tight muscles or a clenched jaw.  Low energy.  Trouble sleeping. Stress can cause unhealthy behaviors, including:  Eating to feel better (overeating) or skipping meals.  Working too much or putting off tasks.  Smoking, drinking alcohol, or using drugs to feel better. How is this diagnosed? Stress is diagnosed through an assessment by your health care provider. He or she may diagnose this condition based on:  Your symptoms and any stressful life events.  Your medical history.  Tests to rule out other causes of your symptoms. Depending on your condition, your health care provider may refer you to a specialist for further evaluation. How is this treated?  Stress management techniques are the recommended treatment for stress. Medicine is not typically recommended for the treatment of stress. Techniques to reduce your reaction to stressful life events include:  Stress identification. Monitor yourself for symptoms of stress and identify what causes stress for you. These skills may help you to avoid or prepare for stressful events.  Time management. Set your priorities, keep a calendar of events, and learn to say no. Taking these actions can help you avoid making too many commitments. Techniques for coping with stress include:  Rethinking the problem. Try to think realistically about stressful events rather than ignoring them or overreacting. Try to find the positives in a stressful situation rather   than focusing on the negatives.  Exercise. Physical exercise can release both physical and emotional tension. The key is to find a form of exercise that you enjoy and do it regularly.  Relaxation techniques. These relax the body and mind. The key is to find one or more that you enjoy and use the techniques regularly. Examples include: ? Meditation, deep breathing, or progressive relaxation  techniques. ? Yoga or tai chi. ? Biofeedback, mindfulness techniques, or journaling. ? Listening to music, being out in nature, or participating in other hobbies.  Practicing a healthy lifestyle. Eat a balanced diet, drink plenty of water, limit or avoid caffeine, and get plenty of sleep.  Having a strong support network. Spend time with family, friends, or other people you enjoy being around. Express your feelings and talk things over with someone you trust. Counseling or talk therapy with a mental health professional may be helpful if you are having trouble managing stress on your own. Follow these instructions at home: Lifestyle   Avoid drugs.  Do not use any products that contain nicotine or tobacco, such as cigarettes, e-cigarettes, and chewing tobacco. If you need help quitting, ask your health care provider.  Limit alcohol intake to no more than 1 drink a day for nonpregnant women and 2 drinks a day for men. One drink equals 12 oz of beer, 5 oz of wine, or 1 oz of hard liquor  Do not use alcohol or drugs to relax.  Eat a balanced diet that includes fresh fruits and vegetables, whole grains, lean meats, fish, eggs, and beans, and low-fat dairy. Avoid processed foods and foods high in added fat, sugar, and salt.  Exercise at least 30 minutes on 5 or more days each week.  Get 7-8 hours of sleep each night. General instructions   Practice stress management techniques as discussed with your health care provider.  Drink enough fluid to keep your urine clear or pale yellow.  Take over-the-counter and prescription medicines only as told by your health care provider.  Keep all follow-up visits as told by your health care provider. This is important. Contact a health care provider if:  Your symptoms get worse.  You have new symptoms.  You feel overwhelmed by your problems and can no longer manage them on your own. Get help right away if:  You have thoughts of hurting  yourself or others. If you ever feel like you may hurt yourself or others, or have thoughts about taking your own life, get help right away. You can go to your nearest emergency department or call:  Your local emergency services (911 in the [place]  A suicide crisis helpline, such as the National Suicide Prevention Lifeline at 1-800-273-8255. This is open 24 hours a day. Summary  Stress is a normal reaction to life events. It can cause problems if it happens too often or for too long.  Practicing stress management techniques is the best way to treat stress.  Counseling or talk therapy with a mental health professional may be helpful if you are having trouble managing stress on your own. This information is not intended to replace advice given to you by your health care provider. Make sure you discuss any questions you have with your health care provider. Document Revised: 10/17/2018 Document Reviewed: 05/09/2016 Elsevier Patient Education  2020 Elsevier Inc.       

## 2020-09-12 LAB — HM PAP SMEAR: HM Pap smear: NORMAL

## 2020-10-04 ENCOUNTER — Telehealth (INDEPENDENT_AMBULATORY_CARE_PROVIDER_SITE_OTHER): Payer: 59 | Admitting: Family Medicine

## 2020-10-04 ENCOUNTER — Encounter: Payer: Self-pay | Admitting: Family Medicine

## 2020-10-04 VITALS — Temp 99.5°F

## 2020-10-04 DIAGNOSIS — U071 COVID-19: Secondary | ICD-10-CM | POA: Diagnosis not present

## 2020-10-04 MED ORDER — BENZONATATE 100 MG PO CAPS: 100.0000 mg | ORAL_CAPSULE | Freq: Three times a day (TID) | ORAL | 0 refills | Status: DC | PRN

## 2020-10-04 NOTE — Patient Instructions (Addendum)
  HOME CARE TIPS:   -I sent the medication(s) we discussed to your pharmacy: Meds ordered this encounter  Medications   benzonatate (TESSALON PERLES) 100 MG capsule    Sig: Take 1 capsule (100 mg total) by mouth 3 (three) times daily as needed.    Dispense:  20 capsule    Refill:  0     -can use tylenol or aleve if needed for fevers, aches and pains per instructions  -can use nasal saline a few times per day if you have nasal congestion; sometimes  a short course of Afrin nasal spray for 3 days can help with symptoms as well  -stay hydrated, drink plenty of fluids and eat small healthy meals - avoid dairy  -can take 1000 IU ( ) Vit D3 and 100-500 mg of Vit C daily per instructions  -check out the CDC website for more information on home care, transmission and treatment for COVID19  -follow up with your doctor in 2-3 days unless improving and feeling better  -stay home while sick, except to seek medical care. If you have COVID19, ideally it would be best to stay home for a full 10 days since the onset of symptoms PLUS one day of no fever and feeling better. Wear a good mask that fits snugly (such as N95 or KN95) if around others to reduce the risk of transmission.  It was nice to meet you today, and I really hope you are feeling better soon. I help [place] out with telemedicine visits on Tuesdays and Thursdays and am available for visits on those days. If you have any concerns or questions following this visit please schedule a follow up visit with your Primary Care doctor or seek care at a local urgent care clinic to avoid delays in care.    Seek in person care or schedule a follow up video visit promptly if your symptoms worsen, new concerns arise or you are not improving with treatment. Call 911 and/or seek emergency care if your symptoms are severe or life threatening.
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
## 2020-11-14 ENCOUNTER — Other Ambulatory Visit: Payer: Self-pay | Admitting: Internal Medicine
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
## 2020-12-12 ENCOUNTER — Other Ambulatory Visit: Payer: Self-pay

## 2020-12-12 ENCOUNTER — Ambulatory Visit (INDEPENDENT_AMBULATORY_CARE_PROVIDER_SITE_OTHER): Payer: 59 | Admitting: Internal Medicine

## 2020-12-12 ENCOUNTER — Encounter: Payer: Self-pay | Admitting: Internal Medicine

## 2020-12-12 VITALS — BP 96/60 | HR 69 | Temp 98.0°F | Ht 68.0 in | Wt 128.0 lb

## 2020-12-12 DIAGNOSIS — Z79899 Other long term (current) drug therapy: Secondary | ICD-10-CM

## 2020-12-12 DIAGNOSIS — Z0000 Encounter for general adult medical examination without abnormal findings: Secondary | ICD-10-CM

## 2020-12-12 DIAGNOSIS — G479 Sleep disorder, unspecified: Secondary | ICD-10-CM | POA: Diagnosis not present

## 2020-12-12 DIAGNOSIS — Z8349 Family history of other endocrine, nutritional and metabolic diseases: Secondary | ICD-10-CM

## 2020-12-12 LAB — CBC WITH DIFFERENTIAL/PLATELET
Basophils Absolute: 0 10*3/uL (ref 0.0–0.1)
Basophils Relative: 0.9 % (ref 0.0–3.0)
Eosinophils Absolute: 0.3 10*3/uL (ref 0.0–0.7)
Eosinophils Relative: 8 % — ABNORMAL HIGH (ref 0.0–5.0)
HCT: 41.4 % (ref 36.0–46.0)
Hemoglobin: 13.6 g/dL (ref 12.0–15.0)
Lymphocytes Relative: 31.7 % (ref 12.0–46.0)
Lymphs Abs: 1.1 10*3/uL (ref 0.7–4.0)
MCHC: 32.9 g/dL (ref 30.0–36.0)
MCV: 88.3 fl (ref 78.0–100.0)
Monocytes Absolute: 0.5 10*3/uL (ref 0.1–1.0)
Monocytes Relative: 13.7 % — ABNORMAL HIGH (ref 3.0–12.0)
Neutro Abs: 1.6 10*3/uL (ref 1.4–7.7)
Neutrophils Relative %: 45.7 % (ref 43.0–77.0)
Platelets: 223 10*3/uL (ref 150.0–400.0)
RBC: 4.69 Mil/uL (ref 3.87–5.11)
RDW: 15.4 % (ref 11.5–15.5)
WBC: 3.6 10*3/uL — ABNORMAL LOW (ref 4.0–10.5)

## 2020-12-12 LAB — HEPATIC FUNCTION PANEL
ALT: 27 U/L (ref 0–35)
AST: 32 U/L (ref 0–37)
Albumin: 3.9 g/dL (ref 3.5–5.2)
Alkaline Phosphatase: 65 U/L (ref 39–117)
Bilirubin, Direct: 0.1 mg/dL (ref 0.0–0.3)
Total Bilirubin: 0.7 mg/dL (ref 0.2–1.2)
Total Protein: 7.1 g/dL (ref 6.0–8.3)

## 2020-12-12 LAB — HEMOGLOBIN A1C: Hgb A1c MFr Bld: 6 % (ref 4.6–6.5)

## 2020-12-12 LAB — BASIC METABOLIC PANEL
BUN: 16 mg/dL (ref 6–23)
CO2: 26 mEq/L (ref 19–32)
Calcium: 9.2 mg/dL (ref 8.4–10.5)
Chloride: 104 mEq/L (ref 96–112)
Creatinine, Ser: 0.61 mg/dL (ref 0.40–1.20)
GFR: 98.56 mL/min (ref >60.00)
Glucose, Bld: 94 mg/dL (ref 70–99)
Potassium: 3.9 mEq/L (ref 3.5–5.1)
Sodium: 138 mEq/L (ref 135–145)

## 2020-12-12 LAB — LIPID PANEL
Cholesterol: 236 mg/dL — ABNORMAL HIGH (ref 0–200)
HDL: 91.2 mg/dL (ref >39.00)
LDL Cholesterol: 135 mg/dL — ABNORMAL HIGH (ref 0–99)
NonHDL: 144.93
Total CHOL/HDL Ratio: 3
Triglycerides: 51 mg/dL (ref 0.0–149.0)
VLDL: 10.2 mg/dL (ref 0.0–40.0)

## 2020-12-12 LAB — TSH: TSH: 2.36 u[IU]/mL (ref 0.35–5.50)

## 2020-12-12 NOTE — Patient Instructions (Signed)
Good to see you today .  Get flu vaccine  this fall.   Let us know or  pharmacy when you need refill of  medication. Avoid regular use of  clonazepam Ambien .   Will notify you  of labs when available.     Health Maintenance, Female Adopting a healthy lifestyle and getting preventive care are important in promoting health and wellness. Ask your health care provider about: The right schedule for you to have regular tests and exams. Things you can do on your own to prevent diseases and keep yourself healthy. What should I know about diet, weight, and exercise? Eat a healthy diet  Eat a diet that includes plenty of vegetables, fruits, low-fat dairy products, and lean protein. Do not eat a lot of foods that are high in solid fats, added sugars, or sodium. Maintain a healthy weight Body mass index (BMI) is used to identify weight problems. It estimates body fat based on height and weight. Your health care provider can help determine your BMI and help you achieve or maintain a healthy weight. Get regular exercise Get regular exercise. This is one of the most important things you can do for your health. Most adults should: Exercise for at least 150 minutes each week. The exercise should increase your heart rate and make you sweat (moderate-intensity exercise). Do strengthening exercises at least twice a week. This is in addition to the moderate-intensity exercise. Spend less time sitting. Even light physical activity can be beneficial. Watch cholesterol and blood lipids Have your blood tested for lipids and cholesterol at 58 years of age, then have this test every 5 years. Have your cholesterol levels checked more often if: Your lipid or cholesterol levels are high. You are older than 58 years of age. You are at high risk for heart disease. What should I know about cancer screening? Depending on your health history and family history, you may need to have cancer screening at various ages.  This may include screening for: Breast cancer. Cervical cancer. Colorectal cancer. Skin cancer. Lung cancer. What should I know about heart disease, diabetes, and high blood pressure? Blood pressure and heart disease High blood pressure causes heart disease and increases the risk of stroke. This is more likely to develop in people who have high blood pressure readings, are of African descent, or are overweight. Have your blood pressure checked: Every 3-5 years if you are 21-83 years of age. Every year if you are 30 years old or older. Diabetes Have regular diabetes screenings. This checks your fasting blood sugar level. Have the screening done: Once every three years after age 54 if you are at a normal weight and have a low risk for diabetes. More often and at a younger age if you are overweight or have a high risk for diabetes. What should I know about preventing infection? Hepatitis B If you have a higher risk for hepatitis B, you should be screened for this virus. Talk with your health care provider to find out if you are at risk for hepatitis B infection. Hepatitis C Testing is recommended for: Everyone born from [birth year] through 1963. Anyone with known risk factors for hepatitis C. Sexually transmitted infections (STIs) Get screened for STIs, including gonorrhea and chlamydia, if: You are sexually active and are younger than 58 years of age. You are older than 58 years of age and your health care provider tells you that you are at risk for this type of infection. Your sexual activity  has changed since you were last screened, and you are at increased risk for chlamydia or gonorrhea. Ask your health care provider if you are at risk. Ask your health care provider about whether you are at high risk for HIV. Your health care provider may recommend a prescription medicine to help prevent HIV infection. If you choose to take medicine to prevent HIV, you should first get tested for HIV. You  should then be tested every 3 months for as long as you are taking the medicine. Pregnancy If you are about to stop having your period (premenopausal) and you may become pregnant, seek counseling before you get pregnant. Take 400 to 800 micrograms (mcg) of folic acid every day if you become pregnant. Ask for birth control (contraception) if you want to prevent pregnancy. Osteoporosis and menopause Osteoporosis is a disease in which the bones lose minerals and strength with aging. This can result in bone fractures. If you are 30 years old or older, or if you are at risk for osteoporosis and fractures, ask your health care provider if you should: Be screened for bone loss. Take a calcium or vitamin D supplement to lower your risk of fractures. Be given hormone replacement therapy (HRT) to treat symptoms of menopause. Follow these instructions at home: Lifestyle Do not use any products that contain nicotine or tobacco, such as cigarettes, e-cigarettes, and chewing tobacco. If you need help quitting, ask your health care provider. Do not use street drugs. Do not share needles. Ask your health care provider for help if you need support or information about quitting drugs. Alcohol use Do not drink alcohol if: Your health care provider tells you not to drink. You are pregnant, may be pregnant, or are planning to become pregnant. If you drink alcohol: Limit how much you use to 0-1 drink a day. Limit intake if you are breastfeeding. Be aware of how much alcohol is in your drink. In the [place] one drink equals one 12 oz bottle of beer (355 mL), one 5 oz glass of wine (148 mL), or one 1 oz glass of hard liquor (44 mL). General instructions Schedule regular health, dental, and eye exams. Stay current with your vaccines. Tell your health care provider if: You often feel depressed. You have ever been abused or do not feel safe at home. Summary Adopting a healthy lifestyle and getting preventive care  are important in promoting health and wellness. Follow your health care provider's instructions about healthy diet, exercising, and getting tested or screened for diseases. Follow your health care provider's instructions on monitoring your cholesterol and blood pressure. This information is not intended to replace advice given to you by your health care provider. Make sure you discuss any questions you have with your health care provider. Document Revised: 05/27/2020 Document Reviewed: 03/12/2018 Elsevier Patient Education  2022 [employer].

## 2020-12-13 NOTE — Progress Notes (Signed)
Cholesterol c total elevated but high HDL thus has a favorable ratio  Liver kidney thyroid and blood sugar normal hemoglobin A1c borderline elevated but no diabetes . Continue lifestyle intervention healthy eating and exercise .   White blood cell count on the low side but not clinically significant would follow your blood count every year this finding may be normal for you.  You are not anemic.

## 2020-12-21 ENCOUNTER — Encounter: Payer: 59 | Admitting: Internal Medicine

## 2021-01-30 ENCOUNTER — Other Ambulatory Visit: Payer: Self-pay | Admitting: Internal Medicine

## 2021-02-02 NOTE — Telephone Encounter (Signed)
Sent in electronically .  

## 2021-05-08 ENCOUNTER — Other Ambulatory Visit: Payer: Self-pay | Admitting: Internal Medicine

## 2021-06-13 ENCOUNTER — Other Ambulatory Visit: Payer: Self-pay | Admitting: Internal Medicine

## 2021-06-13 NOTE — Telephone Encounter (Signed)
Last Ov 12/12/20 ?Filled 05/09/21 ?Is it ok to refill? ?

## 2021-06-15 LAB — HM MAMMOGRAPHY

## 2021-08-29 ENCOUNTER — Other Ambulatory Visit: Payer: Self-pay | Admitting: Internal Medicine

## 2021-08-30 NOTE — Telephone Encounter (Signed)
Last 12/12/21 Filled 06/13/21 Is it ok to refill?

## 2021-11-07 ENCOUNTER — Other Ambulatory Visit: Payer: Self-pay | Admitting: Internal Medicine

## 2021-11-08 NOTE — Telephone Encounter (Signed)
DIAMOX not on current medication list. Clonazepam last filled 08/30/21. Last office visit 12/12/20.
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
## 2021-12-25 ENCOUNTER — Ambulatory Visit (INDEPENDENT_AMBULATORY_CARE_PROVIDER_SITE_OTHER): Payer: 59 | Admitting: Internal Medicine

## 2021-12-25 ENCOUNTER — Encounter: Payer: Self-pay | Admitting: Internal Medicine

## 2021-12-25 VITALS — BP 104/76 | HR 62 | Temp 97.5°F | Ht 67.8 in | Wt 130.6 lb

## 2021-12-25 DIAGNOSIS — Z8349 Family history of other endocrine, nutritional and metabolic diseases: Secondary | ICD-10-CM | POA: Diagnosis not present

## 2021-12-25 DIAGNOSIS — Z0000 Encounter for general adult medical examination without abnormal findings: Secondary | ICD-10-CM

## 2021-12-25 DIAGNOSIS — R7309 Other abnormal glucose: Secondary | ICD-10-CM

## 2021-12-25 DIAGNOSIS — Z8262 Family history of osteoporosis: Secondary | ICD-10-CM

## 2021-12-25 DIAGNOSIS — Z79899 Other long term (current) drug therapy: Secondary | ICD-10-CM | POA: Diagnosis not present

## 2021-12-25 DIAGNOSIS — R7989 Other specified abnormal findings of blood chemistry: Secondary | ICD-10-CM

## 2021-12-25 DIAGNOSIS — E7800 Pure hypercholesterolemia, unspecified: Secondary | ICD-10-CM

## 2021-12-25 LAB — TSH: TSH: 2.15 u[IU]/mL (ref 0.35–5.50)

## 2021-12-25 LAB — LIPID PANEL
Cholesterol: 225 mg/dL — ABNORMAL HIGH (ref 0–200)
HDL: 87.2 mg/dL (ref >39.00)
LDL Cholesterol: 127 mg/dL — ABNORMAL HIGH (ref 0–99)
NonHDL: 137.9
Total CHOL/HDL Ratio: 3
Triglycerides: 54 mg/dL (ref 0.0–149.0)
VLDL: 10.8 mg/dL (ref 0.0–40.0)

## 2021-12-25 LAB — CBC WITH DIFFERENTIAL/PLATELET
Basophils Absolute: 0 10*3/uL (ref 0.0–0.1)
Basophils Relative: 0.9 % (ref 0.0–3.0)
Eosinophils Absolute: 0.2 10*3/uL (ref 0.0–0.7)
Eosinophils Relative: 4.4 % (ref 0.0–5.0)
HCT: 41.8 % (ref 36.0–46.0)
Hemoglobin: 13.9 g/dL (ref 12.0–15.0)
Lymphocytes Relative: 41.9 % (ref 12.0–46.0)
Lymphs Abs: 1.8 10*3/uL (ref 0.7–4.0)
MCHC: 33.3 g/dL (ref 30.0–36.0)
MCV: 89.2 fl (ref 78.0–100.0)
Monocytes Absolute: 0.3 10*3/uL (ref 0.1–1.0)
Monocytes Relative: 6.8 % (ref 3.0–12.0)
Neutro Abs: 2 10*3/uL (ref 1.4–7.7)
Neutrophils Relative %: 46 % (ref 43.0–77.0)
Platelets: 225 10*3/uL (ref 150.0–400.0)
RBC: 4.69 Mil/uL (ref 3.87–5.11)
RDW: 15 % (ref 11.5–15.5)
WBC: 4.4 10*3/uL (ref 4.0–10.5)

## 2021-12-25 LAB — VITAMIN D 25 HYDROXY (VIT D DEFICIENCY, FRACTURES): VITD: 33.45 ng/mL (ref 30.00–100.00)

## 2021-12-25 LAB — BASIC METABOLIC PANEL
BUN: 19 mg/dL (ref 6–23)
CO2: 29 mEq/L (ref 19–32)
Calcium: 9.4 mg/dL (ref 8.4–10.5)
Chloride: 103 mEq/L (ref 96–112)
Creatinine, Ser: 0.65 mg/dL (ref 0.40–1.20)
GFR: 96.36 mL/min (ref >60.00)
Glucose, Bld: 92 mg/dL (ref 70–99)
Potassium: 4.3 mEq/L (ref 3.5–5.1)
Sodium: 139 mEq/L (ref 135–145)

## 2021-12-25 LAB — HEPATIC FUNCTION PANEL
ALT: 27 U/L (ref 0–35)
AST: 40 U/L — ABNORMAL HIGH (ref 0–37)
Albumin: 4.1 g/dL (ref 3.5–5.2)
Alkaline Phosphatase: 72 U/L (ref 39–117)
Bilirubin, Direct: 0.1 mg/dL (ref 0.0–0.3)
Total Bilirubin: 0.7 mg/dL (ref 0.2–1.2)
Total Protein: 7.6 g/dL (ref 6.0–8.3)

## 2021-12-25 NOTE — Patient Instructions (Addendum)
Good to see you today . Continue lifestyle intervention healthy eating and exercise .    Lab today  May want updated  DEXA  through your GYNE.

## 2021-12-26 LAB — HEMOGLOBIN A1C: Hgb A1c MFr Bld: 6 % (ref 4.6–6.5)

## 2021-12-27 ENCOUNTER — Encounter: Payer: Self-pay | Admitting: Internal Medicine

## 2021-12-29 ENCOUNTER — Encounter: Payer: Self-pay | Admitting: Internal Medicine

## 2022-01-14 NOTE — Telephone Encounter (Signed)
See result note  for response    please  arrange and place future lab orders as per  result note.

## 2022-01-14 NOTE — Progress Notes (Signed)
Labs stable except a mild  elevated of ast ( a liver  test)  since all have been normal in past  this could be a temporary abnormality .   I advise  avoid /limit alcohol, advil aleve acetaminophen products   ( these can  cause abnormal liver screen )   the level of abnormal is mild  and not a rare finding and usually transient  and resolve on its own.   Plan blood work in 1-3 months  Lfts , Hep C aby, Hep B aby , hep b AG, ferritin ,  if still abnormal would consider  a liver ultrasound and follow depending .  Apologies for late  response  ( I was out of office for 2 weeks)  If  you have more Questions  and want to discuss further ,then can make a virtual visit in the interim .

## 2022-01-16 ENCOUNTER — Other Ambulatory Visit: Payer: Self-pay

## 2022-01-16 DIAGNOSIS — R7989 Other specified abnormal findings of blood chemistry: Secondary | ICD-10-CM

## 2022-01-17 NOTE — Telephone Encounter (Signed)
Patient is aware of lab result via mychart and lab order and appt was made yesterday by Earlean Shawl, RN.

## 2022-02-12 ENCOUNTER — Other Ambulatory Visit (INDEPENDENT_AMBULATORY_CARE_PROVIDER_SITE_OTHER): Payer: 59

## 2022-02-12 DIAGNOSIS — R7989 Other specified abnormal findings of blood chemistry: Secondary | ICD-10-CM

## 2022-02-12 LAB — HEPATIC FUNCTION PANEL
ALT: 22 U/L (ref 0–35)
AST: 25 U/L (ref 0–37)
Albumin: 3.9 g/dL (ref 3.5–5.2)
Alkaline Phosphatase: 67 U/L (ref 39–117)
Bilirubin, Direct: 0.1 mg/dL (ref 0.0–0.3)
Total Bilirubin: 0.6 mg/dL (ref 0.2–1.2)
Total Protein: 6.8 g/dL (ref 6.0–8.3)

## 2022-02-12 LAB — FERRITIN: Ferritin: 19.5 ng/mL (ref 10.0–291.0)

## 2022-02-12 NOTE — Progress Notes (Signed)
Repeat liver panel is normal ,   rest of labs serologies  not yet back

## 2022-02-13 LAB — HEPATITIS B SURFACE ANTIGEN: Hepatitis B Surface Ag: NONREACTIVE

## 2022-02-13 LAB — HEPATITIS C ANTIBODY: Hepatitis C Ab: NONREACTIVE

## 2022-02-14 NOTE — Progress Notes (Signed)
Serologies negative no further action needed

## 2022-11-15 ENCOUNTER — Encounter (INDEPENDENT_AMBULATORY_CARE_PROVIDER_SITE_OTHER): Payer: Self-pay

## 2022-11-19 ENCOUNTER — Other Ambulatory Visit: Payer: Self-pay | Admitting: Oncology

## 2022-11-19 DIAGNOSIS — Z006 Encounter for examination for normal comparison and control in clinical research program: Secondary | ICD-10-CM

## 2022-12-27 ENCOUNTER — Encounter: Payer: 59 | Admitting: Internal Medicine

## 2023-02-05 ENCOUNTER — Other Ambulatory Visit (HOSPITAL_COMMUNITY)
Admission: RE | Admit: 2023-02-05 | Discharge: 2023-02-05 | Disposition: A | Discharge status: Home / Self Care | Payer: Self-pay | Source: Ambulatory Visit | Attending: Oncology | Admitting: Oncology

## 2023-02-05 DIAGNOSIS — Z006 Encounter for examination for normal comparison and control in clinical research program: Secondary | ICD-10-CM | POA: Insufficient documentation

## 2023-02-06 ENCOUNTER — Encounter: Payer: 59 | Admitting: Internal Medicine
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
## 2023-02-13 ENCOUNTER — Other Ambulatory Visit (INDEPENDENT_AMBULATORY_CARE_PROVIDER_SITE_OTHER): Payer: 59

## 2023-02-13 ENCOUNTER — Encounter: Payer: Self-pay | Admitting: Internal Medicine

## 2023-02-13 ENCOUNTER — Ambulatory Visit (INDEPENDENT_AMBULATORY_CARE_PROVIDER_SITE_OTHER): Payer: 59 | Admitting: Internal Medicine

## 2023-02-13 VITALS — BP 110/80 | HR 61 | Temp 97.7°F | Ht 67.8 in | Wt 126.4 lb

## 2023-02-13 DIAGNOSIS — E7800 Pure hypercholesterolemia, unspecified: Secondary | ICD-10-CM

## 2023-02-13 DIAGNOSIS — R7309 Other abnormal glucose: Secondary | ICD-10-CM

## 2023-02-13 DIAGNOSIS — Z0000 Encounter for general adult medical examination without abnormal findings: Secondary | ICD-10-CM | POA: Diagnosis not present

## 2023-02-13 DIAGNOSIS — Z79899 Other long term (current) drug therapy: Secondary | ICD-10-CM

## 2023-02-13 DIAGNOSIS — Z8262 Family history of osteoporosis: Secondary | ICD-10-CM

## 2023-02-13 DIAGNOSIS — E785 Hyperlipidemia, unspecified: Secondary | ICD-10-CM

## 2023-02-13 LAB — COMPREHENSIVE METABOLIC PANEL
ALT: 23 U/L (ref 0–35)
AST: 28 U/L (ref 0–37)
Albumin: 4.3 g/dL (ref 3.5–5.2)
Alkaline Phosphatase: 64 U/L (ref 39–117)
BUN: 19 mg/dL (ref 6–23)
CO2: 25 meq/L (ref 19–32)
Calcium: 9.1 mg/dL (ref 8.4–10.5)
Chloride: 104 meq/L (ref 96–112)
Creatinine, Ser: 0.72 mg/dL (ref 0.40–1.20)
GFR: 90.78 mL/min (ref >60.00)
Glucose, Bld: 88 mg/dL (ref 70–99)
Potassium: 4 meq/L (ref 3.5–5.1)
Sodium: 138 meq/L (ref 135–145)
Total Bilirubin: 0.8 mg/dL (ref 0.2–1.2)
Total Protein: 7.4 g/dL (ref 6.0–8.3)

## 2023-02-13 LAB — CBC WITH DIFFERENTIAL/PLATELET
Basophils Absolute: 0 10*3/uL (ref 0.0–0.1)
Basophils Relative: 1 % (ref 0.0–3.0)
Eosinophils Absolute: 0.1 10*3/uL (ref 0.0–0.7)
Eosinophils Relative: 2 % (ref 0.0–5.0)
HCT: 39.5 % (ref 36.0–46.0)
Hemoglobin: 13.1 g/dL (ref 12.0–15.0)
Lymphocytes Relative: 35.3 % (ref 12.0–46.0)
Lymphs Abs: 1.2 10*3/uL (ref 0.7–4.0)
MCHC: 33.1 g/dL (ref 30.0–36.0)
MCV: 92.5 fL (ref 78.0–100.0)
Monocytes Absolute: 0.3 10*3/uL (ref 0.1–1.0)
Monocytes Relative: 8.5 % (ref 3.0–12.0)
Neutro Abs: 1.9 10*3/uL (ref 1.4–7.7)
Neutrophils Relative %: 53.2 % (ref 43.0–77.0)
Platelets: 205 10*3/uL (ref 150.0–400.0)
RBC: 4.27 Mil/uL (ref 3.87–5.11)
RDW: 15 % (ref 11.5–15.5)
WBC: 3.5 10*3/uL — ABNORMAL LOW (ref 4.0–10.5)

## 2023-02-13 LAB — HEMOGLOBIN A1C: Hgb A1c MFr Bld: 5.8 % (ref 4.6–6.5)

## 2023-02-13 LAB — TSH: TSH: 2.17 u[IU]/mL (ref 0.35–5.50)

## 2023-02-13 NOTE — Patient Instructions (Signed)
Continue lifestyle intervention healthy eating and exercise .   Checking lipo profile and orders sent for ct calcium score to further assess cardiovascular risk . Your exam is normal .  Fu yearly or depending on results .

## 2023-02-14 ENCOUNTER — Other Ambulatory Visit: Payer: Self-pay | Admitting: Internal Medicine

## 2023-02-14 ENCOUNTER — Encounter: Payer: Self-pay | Admitting: Internal Medicine

## 2023-02-14 DIAGNOSIS — E785 Hyperlipidemia, unspecified: Secondary | ICD-10-CM

## 2023-02-14 LAB — LIPOPROTEIN ANALYSIS BY NMR
HDL Particle Number: 40.4 umol/L (ref >=30.5)
LDL Particle Number: 859 nmol/L (ref <1000)
LDL Size: 21.4 nm (ref >20.5)
LP-IR Score: <25 (ref <=45)
Small LDL Particle Number: <90 nmol/L (ref <=527)

## 2023-02-14 MED ORDER — ZOLPIDEM TARTRATE 10 MG PO TABS: 10.0000 mg | ORAL_TABLET | Freq: Every evening | ORAL | 0 refills | Status: DC | PRN

## 2023-02-14 NOTE — Progress Notes (Signed)
So results so far are favorable ; the lipo profile is low risk  awaiting the lipo a results

## 2023-02-18 NOTE — Telephone Encounter (Signed)
So the lipo med is the  cholesterol  particle assessment  that is the more detailed  and I thought it was supposed to have the total numbers  (  the particle size  and numbers of ldl and hdl are more predictive than  regular panel.)  Larger ldl size is favorable  and low ldl size unfavorable  . ( See chart) Sorry the totals are not on this panel   but your profile is optimum.    If you still wish the regular panel  we can order  another panel  ( fasting) fasting lipid panel at any time  Lindie Spruce  can you place future order for  fasting lipid panel  she can do at her convneience  dx  hyperlipidemia)

## 2023-02-19 LAB — HELIX MOLECULAR SCREEN: Genetic Analysis Overall Interpretation: NEGATIVE

## 2023-02-19 LAB — GENECONNECT MOLECULAR SCREEN

## 2023-02-20 ENCOUNTER — Ambulatory Visit (HOSPITAL_COMMUNITY): Payer: Medicaid Other

## 2023-02-20 LAB — LIPOPROTEIN A (LPA): Lipoprotein (a): 10 nmol/L (ref <75)

## 2023-02-20 NOTE — Telephone Encounter (Signed)
Relay provider's message to pt.   Pt states she would like to do the regular panel.  Lab order placed and lab appt scheduled. Pt is aware that it is a fasting lab.

## 2023-02-21 ENCOUNTER — Other Ambulatory Visit (INDEPENDENT_AMBULATORY_CARE_PROVIDER_SITE_OTHER): Payer: 59

## 2023-02-21 DIAGNOSIS — E785 Hyperlipidemia, unspecified: Secondary | ICD-10-CM

## 2023-02-21 LAB — LIPID PANEL
Cholesterol: 242 mg/dL — ABNORMAL HIGH (ref 0–200)
HDL: 84.3 mg/dL (ref >39.00)
LDL Cholesterol: 148 mg/dL — ABNORMAL HIGH (ref 0–99)
NonHDL: 157.68
Total CHOL/HDL Ratio: 3
Triglycerides: 50 mg/dL (ref 0.0–149.0)
VLDL: 10 mg/dL (ref 0.0–40.0)

## 2023-02-26 NOTE — Progress Notes (Signed)
Lipid panel LDL up from last year  high HDL   The 10-year ASCVD risk score (Arnett DK, et al., 2019) is: 2.1%   Values used to calculate the score:     Age: 62 years     Sex: Female     Is Non-Hispanic African American: No     Diabetic: No     Tobacco smoker: No     Systolic Blood Pressure: 110 mmHg     Is BP treated: No     HDL Cholesterol: 84.3 mg/dL     Total Cholesterol: 242 mg/dL

## 2023-03-04 NOTE — Progress Notes (Signed)
Lipo protein A is favorable

## 2023-03-06 ENCOUNTER — Ambulatory Visit (HOSPITAL_COMMUNITY)
Admission: RE | Admit: 2023-03-06 | Discharge: 2023-03-06 | Disposition: A | Discharge status: Home / Self Care | Payer: Medicaid Other | Source: Ambulatory Visit | Attending: Internal Medicine | Admitting: Internal Medicine

## 2023-03-06 DIAGNOSIS — R7309 Other abnormal glucose: Secondary | ICD-10-CM | POA: Insufficient documentation

## 2023-03-06 DIAGNOSIS — E785 Hyperlipidemia, unspecified: Secondary | ICD-10-CM | POA: Insufficient documentation

## 2023-03-06 DIAGNOSIS — E7800 Pure hypercholesterolemia, unspecified: Secondary | ICD-10-CM | POA: Insufficient documentation

## 2023-03-06 NOTE — Progress Notes (Signed)
Ct calcium score is 0 low risk for near future vascular events

## 2024-03-06 DIAGNOSIS — Z13820 Encounter for screening for osteoporosis: Secondary | ICD-10-CM | POA: Diagnosis not present

## 2024-03-12 ENCOUNTER — Ambulatory Visit: Admitting: Internal Medicine

## 2024-03-12 ENCOUNTER — Encounter: Payer: Self-pay | Admitting: Internal Medicine

## 2024-03-12 VITALS — BP 96/64 | HR 60 | Temp 97.8°F | Ht 67.91 in | Wt 135.4 lb

## 2024-03-12 DIAGNOSIS — Z79899 Other long term (current) drug therapy: Secondary | ICD-10-CM

## 2024-03-12 DIAGNOSIS — Z8669 Personal history of other diseases of the nervous system and sense organs: Secondary | ICD-10-CM | POA: Diagnosis not present

## 2024-03-12 DIAGNOSIS — Z0000 Encounter for general adult medical examination without abnormal findings: Secondary | ICD-10-CM

## 2024-03-12 DIAGNOSIS — E785 Hyperlipidemia, unspecified: Secondary | ICD-10-CM | POA: Diagnosis not present

## 2024-03-12 DIAGNOSIS — Z131 Encounter for screening for diabetes mellitus: Secondary | ICD-10-CM

## 2024-03-12 DIAGNOSIS — R7302 Impaired glucose tolerance (oral): Secondary | ICD-10-CM | POA: Diagnosis not present

## 2024-03-12 LAB — BASIC METABOLIC PANEL WITH GFR
BUN: 15 mg/dL (ref 6–23)
CO2: 29 meq/L (ref 19–32)
Calcium: 9.3 mg/dL (ref 8.4–10.5)
Chloride: 103 meq/L (ref 96–112)
Creatinine, Ser: 0.67 mg/dL (ref 0.40–1.20)
GFR: 94.19 mL/min (ref >60.00)
Glucose, Bld: 93 mg/dL (ref 70–99)
Potassium: 4.4 meq/L (ref 3.5–5.1)
Sodium: 139 meq/L (ref 135–145)

## 2024-03-12 LAB — IBC + FERRITIN
Ferritin: 35 ng/mL (ref 10.0–291.0)
Iron: 118 ug/dL (ref 42–145)
Saturation Ratios: 30.8 % (ref 20.0–50.0)
TIBC: 383.6 ug/dL (ref 250.0–450.0)
Transferrin: 274 mg/dL (ref 212.0–360.0)

## 2024-03-12 LAB — VITAMIN D 25 HYDROXY (VIT D DEFICIENCY, FRACTURES): VITD: 52.53 ng/mL (ref 30.00–100.00)

## 2024-03-12 LAB — CBC WITH DIFFERENTIAL/PLATELET
Basophils Absolute: 0 K/uL (ref 0.0–0.1)
Basophils Relative: 0.7 % (ref 0.0–3.0)
Eosinophils Absolute: 0.1 K/uL (ref 0.0–0.7)
Eosinophils Relative: 3.5 % (ref 0.0–5.0)
HCT: 40.9 % (ref 36.0–46.0)
Hemoglobin: 13.6 g/dL (ref 12.0–15.0)
Lymphocytes Relative: 38.3 % (ref 12.0–46.0)
Lymphs Abs: 1.4 K/uL (ref 0.7–4.0)
MCHC: 33.2 g/dL (ref 30.0–36.0)
MCV: 89.1 fl (ref 78.0–100.0)
Monocytes Absolute: 0.3 K/uL (ref 0.1–1.0)
Monocytes Relative: 7.9 % (ref 3.0–12.0)
Neutro Abs: 1.8 K/uL (ref 1.4–7.7)
Neutrophils Relative %: 49.6 % (ref 43.0–77.0)
Platelets: 266 K/uL (ref 150.0–400.0)
RBC: 4.59 Mil/uL (ref 3.87–5.11)
RDW: 14.7 % (ref 11.5–15.5)
WBC: 3.6 K/uL — ABNORMAL LOW (ref 4.0–10.5)

## 2024-03-12 LAB — HEPATIC FUNCTION PANEL
ALT: 16 U/L (ref 0–35)
AST: 23 U/L (ref 0–37)
Albumin: 4.4 g/dL (ref 3.5–5.2)
Alkaline Phosphatase: 67 U/L (ref 39–117)
Bilirubin, Direct: 0.1 mg/dL (ref 0.0–0.3)
Total Bilirubin: 0.6 mg/dL (ref 0.2–1.2)
Total Protein: 7 g/dL (ref 6.0–8.3)

## 2024-03-12 LAB — LIPID PANEL
Cholesterol: 229 mg/dL — ABNORMAL HIGH (ref 0–200)
HDL: 91.8 mg/dL (ref >39.00)
LDL Cholesterol: 130 mg/dL — ABNORMAL HIGH (ref 0–99)
NonHDL: 137.66
Total CHOL/HDL Ratio: 2
Triglycerides: 38 mg/dL (ref 0.0–149.0)
VLDL: 7.6 mg/dL (ref 0.0–40.0)

## 2024-03-12 LAB — MICROALBUMIN / CREATININE URINE RATIO
Creatinine,U: 36.5 mg/dL
Microalb Creat Ratio: UNDETERMINED mg/g (ref 0.0–30.0)
Microalb, Ur: <0.7 mg/dL

## 2024-03-12 LAB — VITAMIN B12: Vitamin B-12: 607 pg/mL (ref 211–911)

## 2024-03-12 LAB — HEMOGLOBIN A1C: Hgb A1c MFr Bld: 6 % (ref 4.6–6.5)

## 2024-03-12 LAB — TSH: TSH: 1.61 u[IU]/mL (ref 0.35–5.50)

## 2024-03-12 MED ORDER — ZOLPIDEM TARTRATE 10 MG PO TABS: 5.0000 mg | ORAL_TABLET | Freq: Every evening | ORAL | 0 refills | Status: AC | PRN
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
## 2024-03-12 NOTE — Patient Instructions (Addendum)
 Good to see you today   Not sure why blood glucose reads  so high .when exercising . Getting updated  labs today . Can consider  metformin to control  the prediabetic state Lab today  urine if available for protein level  Fu depending on labs   Sample of libre3 plus try

## 2024-03-13 ENCOUNTER — Ambulatory Visit: Payer: Self-pay | Admitting: Internal Medicine

## 2024-03-13 NOTE — Progress Notes (Signed)
 Cholesterol ldl is improved . Blood sugar normal range   A1c in prediabetic range as we suspected.  Rest of labs nl of clinically stable. Keep me updated  about blood sugars   Advise we can get hg A1c on another 6 months (can do in office at appt or lab draw only)

## 2024-03-19 ENCOUNTER — Telehealth: Payer: Self-pay | Admitting: *Deleted

## 2024-03-19 NOTE — Telephone Encounter (Signed)
 Attempted to reach pt. Left a detail message that freestyle libre sample can be pick up at the front desk at her convenient.

## 2024-03-19 NOTE — Telephone Encounter (Signed)
 Copied from CRM #[id number]. Topic: Clinical - Order For Equipment >> Mar 19, 2024  9:14 AM Robinson H wrote: Reason for CRM: Patient following up with [employer] regarding Freestyle Libre device states that she keeps getting message to change the sensor and Willeen is aware and was told she can pick up another device from office and patient calling to see when she can pick it up.  Lemon [phone]

## 2024-03-23 ENCOUNTER — Encounter: Payer: Self-pay | Admitting: Internal Medicine

## 2024-09-10 ENCOUNTER — Ambulatory Visit: Admitting: Internal Medicine
